# Patient Record
Sex: Male | Born: 1975
Health system: Southern US, Community
[De-identification: ages and names within clinical notes are randomized; demographics above are authoritative.]

## PROBLEM LIST (undated history)

## (undated) DIAGNOSIS — N200 Calculus of kidney: Secondary | ICD-10-CM

## (undated) DIAGNOSIS — E785 Hyperlipidemia, unspecified: Secondary | ICD-10-CM

## (undated) DIAGNOSIS — R519 Headache, unspecified: Secondary | ICD-10-CM

## (undated) DIAGNOSIS — K219 Gastro-esophageal reflux disease without esophagitis: Secondary | ICD-10-CM

## (undated) DIAGNOSIS — R51 Headache: Secondary | ICD-10-CM

## (undated) HISTORY — DX: Calculus of kidney: N20.0

## (undated) HISTORY — DX: Hyperlipidemia, unspecified: E78.5

## (undated) HISTORY — PX: OTHER SURGICAL HISTORY: SHX169

---

## 2015-06-23 ENCOUNTER — Ambulatory Visit (INDEPENDENT_AMBULATORY_CARE_PROVIDER_SITE_OTHER): Payer: BLUE CROSS/BLUE SHIELD | Admitting: Family Medicine

## 2015-06-23 ENCOUNTER — Encounter: Payer: Self-pay | Admitting: Family Medicine

## 2015-06-23 VITALS — BP 125/85 | HR 60 | Temp 98.1°F | Ht 68.5 in | Wt 146.5 lb

## 2015-06-23 DIAGNOSIS — R42 Dizziness and giddiness: Secondary | ICD-10-CM

## 2015-06-23 DIAGNOSIS — M542 Cervicalgia: Secondary | ICD-10-CM

## 2015-06-23 DIAGNOSIS — N200 Calculus of kidney: Secondary | ICD-10-CM

## 2015-06-23 LAB — CBC
HCT: 44.4 % (ref 38.5–50.0)
Hemoglobin: 14.9 g/dL (ref 13.2–17.1)
MCH: 30.7 pg (ref 27.0–33.0)
MCHC: 33.6 g/dL (ref 32.0–36.0)
MCV: 91.4 fL (ref 80.0–100.0)
MPV: 10.8 fL (ref 7.5–12.5)
PLATELETS: 204 10*3/uL (ref 140–400)
RBC: 4.86 MIL/uL (ref 4.20–5.80)
RDW: 13.2 % (ref 11.0–15.0)
WBC: 6.4 10*3/uL (ref 3.8–10.8)

## 2015-06-23 LAB — POCT URINALYSIS DIPSTICK
BILIRUBIN UA: NEGATIVE
GLUCOSE UA: NEGATIVE
KETONES UA: NEGATIVE
LEUKOCYTES UA: NEGATIVE
Nitrite, UA: NEGATIVE
PROTEIN UA: NEGATIVE
SPEC GRAV UA: 1.01
Urobilinogen, UA: 0.2
pH, UA: 6

## 2015-06-23 LAB — LIPID PANEL
CHOL/HDL RATIO: 3.8 ratio (ref <=5.0)
CHOLESTEROL: 234 mg/dL — AB (ref 125–200)
HDL: 62 mg/dL (ref >=40)
LDL Cholesterol: 159 mg/dL — ABNORMAL HIGH (ref <130)
TRIGLYCERIDES: 64 mg/dL (ref <150)
VLDL: 13 mg/dL (ref <30)

## 2015-06-23 LAB — HIV ANTIBODY (ROUTINE TESTING W REFLEX): HIV 1&2 Ab, 4th Generation: NONREACTIVE

## 2015-06-23 LAB — COMPLETE METABOLIC PANEL WITH GFR
ALK PHOS: 60 U/L (ref 40–115)
ALT: 15 U/L (ref 9–46)
AST: 20 U/L (ref 10–40)
Albumin: 4.3 g/dL (ref 3.6–5.1)
BILIRUBIN TOTAL: 0.8 mg/dL (ref 0.2–1.2)
BUN: 17 mg/dL (ref 7–25)
CALCIUM: 9 mg/dL (ref 8.6–10.3)
CO2: 27 mmol/L (ref 20–31)
Chloride: 101 mmol/L (ref 98–110)
Creat: 0.86 mg/dL (ref 0.60–1.35)
GFR, Est Non African American: >89 mL/min (ref >=60)
Glucose, Bld: 88 mg/dL (ref 65–99)
Potassium: 4.1 mmol/L (ref 3.5–5.3)
Sodium: 137 mmol/L (ref 135–146)
TOTAL PROTEIN: 7.9 g/dL (ref 6.1–8.1)

## 2015-06-23 LAB — POCT UA - MICROSCOPIC ONLY

## 2015-06-23 NOTE — Assessment & Plan Note (Signed)
Occasional lightheadedness at end of shift. Clinically suspect hypoglycemia. -patient to take a snack to work -check labs today

## 2015-06-23 NOTE — Assessment & Plan Note (Signed)
History of neck pain with occasional right hand numbness. Currently asymptomatic. -monitor clinically

## 2015-06-23 NOTE — Patient Instructions (Signed)
It was nice to meet you today.  Dr. Ree Kida will send you a letter with your results. My nurse will schedule the ultrasound for you.

## 2015-06-23 NOTE — Progress Notes (Signed)
   Subjective:    Patient ID: John Winters, male    DOB: June 12, 1975, 40 y.o.   MRN: [id number]  HPI 40 y/o [place] male presents for new patient visit. Vietnamese Scientist, research (physical sciences) Present.  Left kidney stone - diagnosed in [place] last year (by ultrasound), has rare pain in the left flank, no radiation to groin, no pain in urination, no blood in the urine  Neck pain - no current symptoms, ?pinched nerve on imaging from [place] (per patient record), some occasional right hand numbness (mostly at night, worse with certain sleep positions).   Lightheaded at end of shift (espcially after long shift), improved with food, does not eat during work.    Reviewed New Patient Health Record. Updated PMH/PSH/Meds/Allergies/Social history in EPIC.    Review of Systems  Constitutional: Negative for fever, chills and fatigue.  Respiratory: Negative for cough and shortness of breath.   Cardiovascular: Negative for chest pain and leg swelling.  Gastrointestinal: Negative for nausea, vomiting, abdominal pain and diarrhea.       Objective:   Physical Exam BP 125/85 mmHg  Pulse 60  Temp(Src) 98.1 F (36.7 C) (Oral)  Ht 5' 8.5" (1.74 m)  Wt 146 lb 8 oz (66.452 kg)  BMI 21.95 kg/m2  Gen: pleasant Asian male, NAD HEENT: normocephalic, PERRL, EOMI, no scleral icterus, MMM, uvula midline, no thyromegaly, no cervical adenopathy Cardiac: RRR, S1 and S2 present, no murmur Resp: CTAB, normal effort Abd: soft, no tenderness, normal bowel sounds Ext: no edema, 2+ radial and DP pulses, scar over right peck/nipple that is well healed Neuro: CN 2-12 intact, strength grossly 5/5 in all extremities, normal gait      Assessment & Plan:  Kidney stone on left side History of left kidney stone in [place]. Asymptomatic -check UA and renal US  Neck pain History of neck pain with occasional right hand numbness. Currently asymptomatic. -monitor clinically  Lightheadedness Occasional lightheadedness at  end of shift. Clinically suspect hypoglycemia. -patient to take a snack to work -check labs today

## 2015-06-23 NOTE — Assessment & Plan Note (Signed)
>>  ASSESSMENT AND PLAN FOR KIDNEY STONE ON LEFT SIDE WRITTEN ON 06/23/2015 10:25 AM BY Uvaldo Rising, MD  History of left kidney stone in [place]. Asymptomatic -check UA and renal US

## 2015-06-23 NOTE — Assessment & Plan Note (Signed)
History of left kidney stone in [place]. Asymptomatic -check UA and renal US

## 2015-06-27 ENCOUNTER — Encounter: Payer: Self-pay | Admitting: Family Medicine

## 2015-06-27 DIAGNOSIS — E785 Hyperlipidemia, unspecified: Secondary | ICD-10-CM | POA: Insufficient documentation

## 2015-06-29 ENCOUNTER — Ambulatory Visit (HOSPITAL_COMMUNITY)
Admission: RE | Admit: 2015-06-29 | Discharge: 2015-06-29 | Disposition: A | Discharge status: Home / Self Care | Payer: BLUE CROSS/BLUE SHIELD | Source: Ambulatory Visit | Attending: Family Medicine | Admitting: Family Medicine

## 2015-06-29 DIAGNOSIS — N200 Calculus of kidney: Secondary | ICD-10-CM | POA: Insufficient documentation

## 2015-06-30 ENCOUNTER — Telehealth: Payer: Self-pay | Admitting: Family Medicine

## 2015-06-30 DIAGNOSIS — N200 Calculus of kidney: Secondary | ICD-10-CM

## 2015-06-30 DIAGNOSIS — E785 Hyperlipidemia, unspecified: Secondary | ICD-10-CM

## 2015-06-30 NOTE — Assessment & Plan Note (Signed)
>>  ASSESSMENT AND PLAN FOR KIDNEY STONE ON LEFT SIDE WRITTEN ON 06/30/2015  1:57 PM BY Uvaldo Rising, MD  [place] 06/2015 - 18 mm shadowing focus left uretopelvic juncion

## 2015-06-30 NOTE — Assessment & Plan Note (Signed)
[place] 06/2015 - 18 mm shadowing focus left uretopelvic juncion

## 2015-06-30 NOTE — Telephone Encounter (Signed)
Called patient to discuss renal US results (used [employer]). No answer. Left message that I would call again to discuss results.

## 2015-07-05 ENCOUNTER — Encounter: Payer: Self-pay | Admitting: Family Medicine

## 2015-07-05 ENCOUNTER — Telehealth: Payer: Self-pay | Admitting: Family Medicine

## 2015-07-05 DIAGNOSIS — N200 Calculus of kidney: Secondary | ICD-10-CM

## 2015-07-05 NOTE — Assessment & Plan Note (Signed)
Placed referral to Urology.

## 2015-07-05 NOTE — Telephone Encounter (Signed)
Called patient and informed him of kidney stone in the left ureter. Will place referral to urology.

## 2015-07-05 NOTE — Telephone Encounter (Signed)
Attempted to call patient again about [place] results. No answer. Left message that I would send a letter with the results. Used [employer].

## 2015-07-05 NOTE — Assessment & Plan Note (Signed)
>>  ASSESSMENT AND PLAN FOR KIDNEY STONE ON LEFT SIDE WRITTEN ON 07/05/2015  3:59 PM BY Uvaldo Rising, MD  Placed referral to Urology.

## 2015-07-13 ENCOUNTER — Other Ambulatory Visit: Payer: Self-pay | Admitting: Urology

## 2015-07-13 DIAGNOSIS — N200 Calculus of kidney: Secondary | ICD-10-CM

## 2015-07-13 DIAGNOSIS — N1330 Unspecified hydronephrosis: Secondary | ICD-10-CM

## 2015-08-26 ENCOUNTER — Ambulatory Visit (HOSPITAL_COMMUNITY)
Admission: RE | Admit: 2015-08-26 | Discharge: 2015-08-26 | Disposition: A | Discharge status: Home / Self Care | Payer: BLUE CROSS/BLUE SHIELD | Source: Ambulatory Visit | Attending: Urology | Admitting: Urology

## 2015-08-26 DIAGNOSIS — N130 Hydronephrosis with ureteropelvic junction obstruction: Secondary | ICD-10-CM | POA: Diagnosis not present

## 2015-08-26 DIAGNOSIS — N1330 Unspecified hydronephrosis: Secondary | ICD-10-CM

## 2015-08-26 DIAGNOSIS — N200 Calculus of kidney: Secondary | ICD-10-CM

## 2015-08-26 DIAGNOSIS — R39198 Other difficulties with micturition: Secondary | ICD-10-CM | POA: Diagnosis not present

## 2015-08-26 MED ORDER — FUROSEMIDE 10 MG/ML IJ SOLN
INTRAMUSCULAR | Status: AC
  Filled 2015-08-26: qty 4

## 2015-08-26 MED ORDER — TECHNETIUM TC 99M MERTIATIDE
15.0000 | Freq: Once | INTRAVENOUS | Status: AC | PRN
  Administered 2015-08-26: 15 via INTRAVENOUS

## 2015-08-26 MED ORDER — FUROSEMIDE 10 MG/ML IJ SOLN
33.0000 mg | Freq: Once | INTRAMUSCULAR | Status: AC
Start: 2015-08-26 — End: 2015-08-26
  Administered 2015-08-26: 33 mg via INTRAVENOUS

## 2015-08-30 ENCOUNTER — Other Ambulatory Visit: Payer: Self-pay | Admitting: Urology

## 2015-09-08 NOTE — Patient Instructions (Signed)
John Winters  09/08/2015   Your procedure is scheduled on: 09/16/2015    Report to [facility] Main  Entrance take [place]  elevators to 3rd floor to  [place] at    0800 AM.  Call this number if you have problems the morning of surgery [phone]   Remember: ONLY 1 PERSON MAY GO WITH YOU TO SHORT STAY TO GET  READY MORNING OF [place].  Do not eat food or drink liquids :After Midnight.            CLEAR LIQUID DIET ON Thursday 09/15/2015.             Magnesium Citrate - DRINK  1 bottle by 12 NOON THE DAY BEFORE SURGERY       Take these medicines the morning of surgery with A SIP OF WATER: none                                 You may not have any metal on your body including hair pins and              piercings  Do not wear jewelry,  lotions, powders or perfumes, deodorant                         Men may shave face and neck.   Do not bring valuables to the hospital. [place].  Contacts, dentures or bridgework may not be worn into surgery.  Leave suitcase in the car. After surgery it may be brought to your room.      Special Instructions:               Please read over the following fact sheets you were given: _____________________________________________________________________                CLEAR LIQUID DIET   Foods Allowed                                                                     Foods Excluded  Coffee and tea, regular and decaf                             liquids that you cannot  Plain Jell-O in any flavor                                             see through such as: Fruit ices (not with fruit pulp)                                     milk, soups, orange juice  Iced Popsicles  All solid food Carbonated beverages, regular and diet                                    Cranberry, grape and apple juices Sports drinks like Gatorade Lightly  seasoned clear broth or consume(fat free) Sugar, honey syrup  Sample Menu Breakfast                                Lunch                                     Supper Cranberry juice                    Beef broth                            Chicken broth Jell-O                                     Grape juice                           Apple juice Coffee or tea                        Jell-O                                      Popsicle                                                Coffee or tea                        Coffee or tea  _____________________________________________________________________  [facility] - Preparing for Surgery Before surgery, you can play an important role.  Because skin is not sterile, your skin needs to be as free of germs as possible.  You can reduce the number of germs on your skin by washing with CHG (chlorahexidine gluconate) soap before surgery.  CHG is an antiseptic cleaner which kills germs and bonds with the skin to continue killing germs even after washing. Please DO NOT use if you have an allergy to CHG or antibacterial soaps.  If your skin becomes reddened/irritated stop using the CHG and inform your nurse when you arrive at Short Stay. Do not shave (including legs and underarms) for at least 48 hours prior to the first CHG shower.  You may shave your face/neck. Please follow these instructions carefully:  1.  Shower with CHG Soap the night before surgery and the  morning of Surgery.  2.  If you choose to wash your hair, wash your hair first as usual with your  normal  shampoo.  3.  After you shampoo, rinse your hair and body thoroughly to remove the  shampoo.  4.  Use CHG as you would any other liquid soap.  You can apply chg directly  to the skin and wash                       Gently with a scrungie or clean washcloth.  5.  Apply the CHG Soap to your body ONLY FROM THE NECK DOWN.   Do not use on face/ open                            Wound or open sores. Avoid contact with eyes, ears mouth and genitals (private parts).                       Wash face,  Genitals (private parts) with your normal soap.             6.  Wash thoroughly, paying special attention to the area where your surgery  will be performed.  7.  Thoroughly rinse your body with warm water from the neck down.  8.  DO NOT shower/wash with your normal soap after using and rinsing off  the CHG Soap.                9.  Pat yourself dry with a clean towel.            10.  Wear clean pajamas.            11.  Place clean sheets on your bed the night of your first shower and do not  sleep with pets. Day of Surgery : Do not apply any lotions/deodorants the morning of surgery.  Please wear clean clothes to the hospital/surgery center.  FAILURE TO FOLLOW THESE INSTRUCTIONS MAY RESULT IN THE CANCELLATION OF YOUR SURGERY PATIENT SIGNATURE_________________________________  NURSE SIGNATURE__________________________________  ________________________________________________________________________

## 2015-09-12 ENCOUNTER — Encounter (HOSPITAL_COMMUNITY)
Admission: RE | Admit: 2015-09-12 | Discharge: 2015-09-12 | Disposition: A | Discharge status: Home / Self Care | Payer: BLUE CROSS/BLUE SHIELD | Source: Ambulatory Visit | Attending: Urology | Admitting: Urology

## 2015-09-12 ENCOUNTER — Encounter (HOSPITAL_COMMUNITY): Payer: Self-pay

## 2015-09-12 DIAGNOSIS — N200 Calculus of kidney: Secondary | ICD-10-CM | POA: Diagnosis not present

## 2015-09-12 DIAGNOSIS — Z01812 Encounter for preprocedural laboratory examination: Secondary | ICD-10-CM | POA: Diagnosis present

## 2015-09-12 DIAGNOSIS — N201 Calculus of ureter: Secondary | ICD-10-CM | POA: Insufficient documentation

## 2015-09-12 HISTORY — DX: Headache: R51

## 2015-09-12 HISTORY — DX: Gastro-esophageal reflux disease without esophagitis: K21.9

## 2015-09-12 HISTORY — DX: Headache, unspecified: R51.9

## 2015-09-12 LAB — CBC
HCT: 44.3 % (ref 39.0–52.0)
Hemoglobin: 15.7 g/dL (ref 13.0–17.0)
MCH: 31.6 pg (ref 26.0–34.0)
MCHC: 35.4 g/dL (ref 30.0–36.0)
MCV: 89.1 fL (ref 78.0–100.0)
PLATELETS: 210 10*3/uL (ref 150–400)
RBC: 4.97 MIL/uL (ref 4.22–5.81)
RDW: 12.4 % (ref 11.5–15.5)
WBC: 5.8 10*3/uL (ref 4.0–10.5)

## 2015-09-12 LAB — BASIC METABOLIC PANEL
Anion gap: 5 (ref 5–15)
BUN: 20 mg/dL (ref 6–20)
CALCIUM: 9.1 mg/dL (ref 8.9–10.3)
CO2: 28 mmol/L (ref 22–32)
CREATININE: 0.78 mg/dL (ref 0.61–1.24)
Chloride: 105 mmol/L (ref 101–111)
GFR calc non Af Amer: >60 mL/min (ref >60)
Glucose, Bld: 103 mg/dL — ABNORMAL HIGH (ref 65–99)
Potassium: 4.6 mmol/L (ref 3.5–5.1)
SODIUM: 138 mmol/L (ref 135–145)

## 2015-09-15 MED ORDER — GENTAMICIN SULFATE 40 MG/ML IJ SOLN
5.0000 mg/kg | INTRAVENOUS | Status: AC
  Administered 2015-09-16: 340 mg via INTRAVENOUS
  Filled 2015-09-15: qty 8.5

## 2015-09-16 ENCOUNTER — Inpatient Hospital Stay (HOSPITAL_COMMUNITY): Payer: BLUE CROSS/BLUE SHIELD

## 2015-09-16 ENCOUNTER — Encounter (HOSPITAL_COMMUNITY): Payer: Self-pay

## 2015-09-16 ENCOUNTER — Observation Stay (HOSPITAL_COMMUNITY)
Admission: RE | Admit: 2015-09-16 | Discharge: 2015-09-17 | Disposition: A | Discharge status: Home / Self Care | Payer: BLUE CROSS/BLUE SHIELD | Source: Ambulatory Visit | Attending: Urology | Admitting: Urology

## 2015-09-16 ENCOUNTER — Inpatient Hospital Stay (HOSPITAL_COMMUNITY): Payer: BLUE CROSS/BLUE SHIELD | Admitting: Certified Registered Nurse Anesthetist

## 2015-09-16 ENCOUNTER — Encounter (HOSPITAL_COMMUNITY)
Admission: RE | Disposition: A | Discharge status: Home / Self Care | Payer: Self-pay | Source: Ambulatory Visit | Attending: Urology

## 2015-09-16 DIAGNOSIS — Z8249 Family history of ischemic heart disease and other diseases of the circulatory system: Secondary | ICD-10-CM | POA: Insufficient documentation

## 2015-09-16 DIAGNOSIS — N132 Hydronephrosis with renal and ureteral calculous obstruction: Principal | ICD-10-CM | POA: Insufficient documentation

## 2015-09-16 DIAGNOSIS — Z419 Encounter for procedure for purposes other than remedying health state, unspecified: Secondary | ICD-10-CM

## 2015-09-16 DIAGNOSIS — N200 Calculus of kidney: Secondary | ICD-10-CM | POA: Diagnosis present

## 2015-09-16 DIAGNOSIS — K219 Gastro-esophageal reflux disease without esophagitis: Secondary | ICD-10-CM | POA: Insufficient documentation

## 2015-09-16 HISTORY — PX: CYSTOSCOPY W/ URETERAL STENT PLACEMENT: SHX1429

## 2015-09-16 HISTORY — PX: NEPHROLITHOTOMY: SHX5134

## 2015-09-16 LAB — ABO/RH: ABO/RH(D): A POS

## 2015-09-16 LAB — HEMOGLOBIN AND HEMATOCRIT, BLOOD
HCT: 45.1 % (ref 39.0–52.0)
Hemoglobin: 15.2 g/dL (ref 13.0–17.0)

## 2015-09-16 LAB — TYPE AND SCREEN
ABO/RH(D): A POS
Antibody Screen: NEGATIVE

## 2015-09-16 SURGERY — NEPHROLITHOTOMY PERCUTANEOUS
Anesthesia: General | Laterality: Left

## 2015-09-16 MED ORDER — KCL IN DEXTROSE-NACL 20-5-0.45 MEQ/L-%-% IV SOLN
INTRAVENOUS | Status: DC
  Administered 2015-09-16 – 2015-09-17 (×2): via INTRAVENOUS
  Filled 2015-09-16 (×2): qty 1000

## 2015-09-16 MED ORDER — OXYCODONE HCL 5 MG/5ML PO SOLN: 5.0000 mg | Freq: Once | ORAL | Status: DC | PRN

## 2015-09-16 MED ORDER — SENNOSIDES-DOCUSATE SODIUM 8.6-50 MG PO TABS: 1.0000 | ORAL_TABLET | Freq: Two times a day (BID) | ORAL | Status: DC

## 2015-09-16 MED ORDER — IOHEXOL 300 MG/ML  SOLN
INTRAMUSCULAR | Status: DC | PRN
  Administered 2015-09-16: 45 mL via URETHRAL

## 2015-09-16 MED ORDER — PROPOFOL 10 MG/ML IV BOLUS
INTRAVENOUS | Status: AC
  Filled 2015-09-16: qty 20

## 2015-09-16 MED ORDER — ROCURONIUM BROMIDE 100 MG/10ML IV SOLN
INTRAVENOUS | Status: DC | PRN
  Administered 2015-09-16: 20 mg via INTRAVENOUS
  Administered 2015-09-16 (×3): 10 mg via INTRAVENOUS
  Administered 2015-09-16: 50 mg via INTRAVENOUS

## 2015-09-16 MED ORDER — MIDAZOLAM HCL 2 MG/2ML IJ SOLN
INTRAMUSCULAR | Status: AC
  Filled 2015-09-16: qty 2

## 2015-09-16 MED ORDER — LACTATED RINGERS IV SOLN
INTRAVENOUS | Status: DC
  Administered 2015-09-16: 1000 mL via INTRAVENOUS
  Administered 2015-09-16: 13:00:00 via INTRAVENOUS

## 2015-09-16 MED ORDER — EPHEDRINE SULFATE 50 MG/ML IJ SOLN
INTRAMUSCULAR | Status: DC | PRN
  Administered 2015-09-16: 5 mg via INTRAVENOUS

## 2015-09-16 MED ORDER — ONDANSETRON HCL 4 MG/2ML IJ SOLN
INTRAMUSCULAR | Status: AC
  Filled 2015-09-16: qty 2

## 2015-09-16 MED ORDER — LIDOCAINE HCL (CARDIAC) 20 MG/ML IV SOLN
INTRAVENOUS | Status: AC
  Filled 2015-09-16: qty 5

## 2015-09-16 MED ORDER — LIDOCAINE HCL (CARDIAC) 20 MG/ML IV SOLN
INTRAVENOUS | Status: DC | PRN
  Administered 2015-09-16: 50 mg via INTRAVENOUS

## 2015-09-16 MED ORDER — CEPHALEXIN 500 MG PO CAPS: 500.0000 mg | ORAL_CAPSULE | Freq: Two times a day (BID) | ORAL | Status: DC

## 2015-09-16 MED ORDER — METOCLOPRAMIDE HCL 5 MG/ML IJ SOLN
INTRAMUSCULAR | Status: AC
  Filled 2015-09-16: qty 2

## 2015-09-16 MED ORDER — METOCLOPRAMIDE HCL 5 MG/ML IJ SOLN
INTRAMUSCULAR | Status: DC | PRN
  Administered 2015-09-16: 10 mg via INTRAVENOUS

## 2015-09-16 MED ORDER — FENTANYL CITRATE (PF) 250 MCG/5ML IJ SOLN
INTRAMUSCULAR | Status: AC
  Filled 2015-09-16: qty 5

## 2015-09-16 MED ORDER — ONDANSETRON HCL 4 MG/2ML IJ SOLN
INTRAMUSCULAR | Status: DC | PRN
  Administered 2015-09-16: 4 mg via INTRAVENOUS

## 2015-09-16 MED ORDER — HYDROMORPHONE HCL 1 MG/ML IJ SOLN: 0.5000 mg | INTRAMUSCULAR | Status: DC | PRN

## 2015-09-16 MED ORDER — FENTANYL CITRATE (PF) 100 MCG/2ML IJ SOLN
INTRAMUSCULAR | Status: DC | PRN
  Administered 2015-09-16 (×5): 50 ug via INTRAVENOUS

## 2015-09-16 MED ORDER — OXYCODONE-ACETAMINOPHEN 5-325 MG PO TABS: 1.0000 | ORAL_TABLET | ORAL | Status: DC | PRN

## 2015-09-16 MED ORDER — OXYCODONE HCL 5 MG PO TABS: 5.0000 mg | ORAL_TABLET | Freq: Once | ORAL | Status: DC | PRN

## 2015-09-16 MED ORDER — DEXAMETHASONE SODIUM PHOSPHATE 4 MG/ML IJ SOLN
INTRAMUSCULAR | Status: DC | PRN
  Administered 2015-09-16: 10 mg via INTRAVENOUS

## 2015-09-16 MED ORDER — MAGNESIUM CITRATE PO SOLN: 1.0000 | Freq: Once | ORAL | Status: DC

## 2015-09-16 MED ORDER — SENNOSIDES-DOCUSATE SODIUM 8.6-50 MG PO TABS
1.0000 | ORAL_TABLET | Freq: Two times a day (BID) | ORAL | Status: DC
  Administered 2015-09-16 (×2): 1 via ORAL
  Filled 2015-09-16 (×3): qty 1

## 2015-09-16 MED ORDER — HYDROMORPHONE HCL 1 MG/ML IJ SOLN: 0.2500 mg | INTRAMUSCULAR | Status: DC | PRN

## 2015-09-16 MED ORDER — ACETAMINOPHEN 500 MG PO TABS
1000.0000 mg | ORAL_TABLET | Freq: Three times a day (TID) | ORAL | Status: AC
  Administered 2015-09-16 – 2015-09-17 (×3): 1000 mg via ORAL
  Filled 2015-09-16 (×3): qty 2

## 2015-09-16 MED ORDER — SUGAMMADEX SODIUM 200 MG/2ML IV SOLN
INTRAVENOUS | Status: DC | PRN
  Administered 2015-09-16: 200 mg via INTRAVENOUS

## 2015-09-16 MED ORDER — SODIUM CHLORIDE 0.9 % IR SOLN
Status: DC | PRN
  Administered 2015-09-16: 15000 mL

## 2015-09-16 MED ORDER — ROCURONIUM BROMIDE 100 MG/10ML IV SOLN
INTRAVENOUS | Status: AC
  Filled 2015-09-16: qty 1

## 2015-09-16 MED ORDER — SUGAMMADEX SODIUM 200 MG/2ML IV SOLN
INTRAVENOUS | Status: AC
  Filled 2015-09-16: qty 2

## 2015-09-16 MED ORDER — MEPERIDINE HCL 50 MG/ML IJ SOLN: 6.2500 mg | INTRAMUSCULAR | Status: DC | PRN

## 2015-09-16 MED ORDER — PROPOFOL 10 MG/ML IV BOLUS
INTRAVENOUS | Status: DC | PRN
  Administered 2015-09-16: 200 mg via INTRAVENOUS

## 2015-09-16 MED ORDER — MIDAZOLAM HCL 5 MG/5ML IJ SOLN
INTRAMUSCULAR | Status: DC | PRN
  Administered 2015-09-16 (×2): 1 mg via INTRAVENOUS

## 2015-09-16 MED ORDER — DEXAMETHASONE SODIUM PHOSPHATE 10 MG/ML IJ SOLN
INTRAMUSCULAR | Status: AC
  Filled 2015-09-16: qty 1

## 2015-09-16 SURGICAL SUPPLY — 63 items
BAG URINE DRAINAGE (UROLOGICAL SUPPLIES) ×4 IMPLANT
BAG URO CATCHER STRL LF (MISCELLANEOUS) ×2 IMPLANT
BASKET LASER NITINOL 1.9FR (BASKET) ×4 IMPLANT
BASKET ZERO TIP NITINOL 2.4FR (BASKET) ×2 IMPLANT
BENZOIN TINCTURE PRP APPL 2/3 (GAUZE/BANDAGES/DRESSINGS) ×4 IMPLANT
BLADE SURG 15 STRL LF DISP TIS (BLADE) ×1 IMPLANT
BLADE SURG 15 STRL SS (BLADE) ×1
CATH FOLEY 2W COUNCIL 20FR 5CC (CATHETERS) IMPLANT
CATH FOLEY 2WAY SLVR  5CC 16FR (CATHETERS) ×1
CATH FOLEY 2WAY SLVR 5CC 16FR (CATHETERS) ×1 IMPLANT
CATH IMAGER II 65CM (CATHETERS) ×4 IMPLANT
CATH INTERMIT  6FR 70CM (CATHETERS) ×2 IMPLANT
CATH ROBINSON RED A/P 20FR (CATHETERS) IMPLANT
CATH X-FORCE N30 NEPHROSTOMY (TUBING) ×2 IMPLANT
CHLORAPREP W/TINT 26ML (MISCELLANEOUS) ×4 IMPLANT
CLOTH BEACON ORANGE TIMEOUT ST (SAFETY) ×2 IMPLANT
COVER SURGICAL LIGHT HANDLE (MISCELLANEOUS) ×2 IMPLANT
DRAPE C-ARM 42X120 X-RAY (DRAPES) ×2 IMPLANT
DRAPE LINGEMAN PERC (DRAPES) ×2 IMPLANT
DRAPE SHEET LG 3/4 BI-LAMINATE (DRAPES) ×2 IMPLANT
DRAPE SURG IRRIG POUCH 19X23 (DRAPES) ×2 IMPLANT
DRSG PAD ABDOMINAL 8X10 ST (GAUZE/BANDAGES/DRESSINGS) ×4 IMPLANT
DRSG TEGADERM 4X4.75 (GAUZE/BANDAGES/DRESSINGS) ×4 IMPLANT
DRSG TEGADERM 8X12 (GAUZE/BANDAGES/DRESSINGS) ×2 IMPLANT
FIBER LASER FLEXIVA 1000 (UROLOGICAL SUPPLIES) ×2 IMPLANT
FIBER LASER FLEXIVA 365 (UROLOGICAL SUPPLIES) IMPLANT
FIBER LASER FLEXIVA 550 (UROLOGICAL SUPPLIES) IMPLANT
FIBER LASER TRAC TIP (UROLOGICAL SUPPLIES) IMPLANT
GAUZE SPONGE 4X4 12PLY STRL (GAUZE/BANDAGES/DRESSINGS) ×2 IMPLANT
GLOVE BIOGEL M STRL SZ7.5 (GLOVE) ×6 IMPLANT
GOWN STRL REUS W/TWL LRG LVL3 (GOWN DISPOSABLE) ×4 IMPLANT
GUIDEWIRE AMPLAZ .035X145 (WIRE) ×4 IMPLANT
GUIDEWIRE ANG ZIPWIRE 038X150 (WIRE) ×4 IMPLANT
GUIDEWIRE STR DUAL SENSOR (WIRE) ×2 IMPLANT
IV SET EXTENSION CATH 6 NF (IV SETS) ×2 IMPLANT
KIT BASIN OR (CUSTOM PROCEDURE TRAY) ×2 IMPLANT
MANIFOLD NEPTUNE II (INSTRUMENTS) ×2 IMPLANT
NEEDLE TROCAR 18X15 ECHO (NEEDLE) IMPLANT
NEEDLE TROCAR 18X20 (NEEDLE) ×2 IMPLANT
NS IRRIG 1000ML POUR BTL (IV SOLUTION) IMPLANT
PACK CYSTO (CUSTOM PROCEDURE TRAY) ×2 IMPLANT
PROBE LITHOCLAST ULTRA 3.8X403 (UROLOGICAL SUPPLIES) ×2 IMPLANT
PROBE PNEUMATIC 1.0MMX570MM (UROLOGICAL SUPPLIES) ×2 IMPLANT
SET IRRIG Y TYPE TUR BLADDER L (SET/KITS/TRAYS/PACK) ×2 IMPLANT
SHEATH PEELAWAY SET 9 (SHEATH) ×4 IMPLANT
SPOGE SURGIFLO 8M (HEMOSTASIS) ×1
SPONGE LAP 4X18 X RAY DECT (DISPOSABLE) ×2 IMPLANT
SPONGE SURGIFLO 8M (HEMOSTASIS) ×1 IMPLANT
STENT URET 6FRX26 CONTOUR (STENTS) ×2 IMPLANT
STONE CATCHER W/TUBE ADAPTER (UROLOGICAL SUPPLIES) ×2 IMPLANT
SUT SILK 2 0 30  PSL (SUTURE) ×2
SUT SILK 2 0 30 PSL (SUTURE) ×2 IMPLANT
SUT VIC AB 2-0 SH 27 (SUTURE) ×1
SUT VIC AB 2-0 SH 27X BRD (SUTURE) ×1 IMPLANT
SYR 20CC LL (SYRINGE) ×4 IMPLANT
SYR 50ML LL SCALE MARK (SYRINGE) ×2 IMPLANT
SYRINGE 10CC LL (SYRINGE) ×2 IMPLANT
TOWEL OR 17X26 10 PK STRL BLUE (TOWEL DISPOSABLE) ×2 IMPLANT
TRAY FOLEY CATH 16FR SILVER (SET/KITS/TRAYS/PACK) ×2 IMPLANT
TUBE FEEDING 8FR 16IN STR KANG (MISCELLANEOUS) ×2 IMPLANT
TUBING CONNECTING 10 (TUBING) ×6 IMPLANT
WATER STERILE IRR 1500ML POUR (IV SOLUTION) ×2 IMPLANT
WATER STERILE IRR 3000ML UROMA (IV SOLUTION) IMPLANT

## 2015-09-16 NOTE — Anesthesia Preprocedure Evaluation (Addendum)
Anesthesia Evaluation  Patient identified by MRN, date of birth, ID band Patient awake    Reviewed: Allergy & Precautions, NPO status , Patient's Chart, lab work & pertinent test results  Airway Mallampati: I  TM Distance: >3 FB Neck ROM: Full    Dental  (+) Teeth Intact, Dental Advisory Given   Pulmonary    breath sounds clear to auscultation       Cardiovascular  Rhythm:Regular Rate:Normal     Neuro/Psych    GI/Hepatic GERD  Medicated and Controlled,  Endo/Other    Renal/GU      Musculoskeletal   Abdominal   Peds  Hematology   Anesthesia Other Findings Hx confirmed with interpreter.  Questions answered.  Reproductive/Obstetrics                            Anesthesia Physical Anesthesia Plan  ASA: II  Anesthesia Plan: General   Post-op Pain Management:    Induction: Intravenous  Airway Management Planned: Oral ETT  Additional Equipment:   Intra-op Plan:   Post-operative Plan: Extubation in OR  Informed Consent: I have reviewed the patients History and Physical, chart, labs and discussed the procedure including the risks, benefits and alternatives for the proposed anesthesia with the patient or authorized representative who has indicated his/her understanding and acceptance.   Dental advisory given  Plan Discussed with: CRNA, Anesthesiologist and Surgeon  Anesthesia Plan Comments:         Anesthesia Quick Evaluation

## 2015-09-16 NOTE — Anesthesia Postprocedure Evaluation (Signed)
Anesthesia Post Note  Patient: John Winters  Procedure(s) Performed: Procedure(s) (LRB): LEFT PERCUTANEOUS NEPHROLITHOTOMY WITH SURGEON ACCESS  (Left) CYSTOSCOPY WITH LEFT RETROGRADE PYELOGRAM LEFT URETERAL STENT PLACEMENT (Left)  Patient location during evaluation: PACU Anesthesia Type: General Level of consciousness: awake and alert Pain management: pain level controlled Vital Signs Assessment: post-procedure vital signs reviewed and stable Respiratory status: spontaneous breathing, nonlabored ventilation and respiratory function stable Cardiovascular status: blood pressure returned to baseline and stable Postop Assessment: no signs of nausea or vomiting Anesthetic complications: no    Last Vitals:  Filed Vitals:   09/16/15 1330 09/16/15 1345  BP: 122/84 123/84  Pulse: 65 69  Temp:  36.7 C  Resp: 18 17    Last Pain:  Filed Vitals:   09/16/15 1349  PainSc: Asleep                 Kai Calico A

## 2015-09-16 NOTE — Transfer of Care (Signed)
Immediate Anesthesia Transfer of Care Note  Patient: John Winters  Procedure(s) Performed: Procedure(s): LEFT PERCUTANEOUS NEPHROLITHOTOMY WITH SURGEON ACCESS  (Left) CYSTOSCOPY WITH LEFT RETROGRADE PYELOGRAM LEFT URETERAL STENT PLACEMENT (Left)  Patient Location: PACU  Anesthesia Type:General  Level of Consciousness: Patient easily awoken, sedated, comfortable, cooperative, following commands, responds to stimulation.   Airway & Oxygen Therapy: Patient spontaneously breathing, ventilating well, oxygen via simple oxygen mask.  Post-op Assessment: Report given to PACU RN, vital signs reviewed and stable, moving all extremities.   Post vital signs: Reviewed and stable.  Complications: No apparent anesthesia complications

## 2015-09-16 NOTE — Brief Op Note (Signed)
09/16/2015  12:34 PM  PATIENT:  Waunita Schooner  40 y.o. male  PRE-OPERATIVE DIAGNOSIS:  LARGE LEFT RENAL / URETERAL STONE  POST-OPERATIVE DIAGNOSIS:  LARGE LEFT RENAL / URETERAL STONE  PROCEDURE:  Procedure(s): LEFT PERCUTANEOUS NEPHROLITHOTOMY WITH SURGEON ACCESS  (Left) CYSTOSCOPY WITH LEFT RETROGRADE PYELOGRAM LEFT URETERAL STENT PLACEMENT (Left)  SURGEON:  Surgeon(s) and Role:    * Alexis Frock, MD - Primary  PHYSICIAN ASSISTANT:   ASSISTANTS: none   ANESTHESIA:   general  EBL:     BLOOD ADMINISTERED:none  DRAINS: foley to gravity   LOCAL MEDICATIONS USED:  NONE  SPECIMEN:  Source of Specimen:  Left real stone fragmets   DISPOSITION OF SPECIMEN:  [facility] for compositional analysis  COUNTS:  YES  TOURNIQUET:  * No tourniquets in log *  DICTATION: .Other Dictation: Dictation Number [id number]  PLAN OF CARE: Admit for overnight observation  PATIENT DISPOSITION:  PACU - hemodynamically stable.   Delay start of Pharmacological VTE agent (>24hrs) due to surgical blood loss or risk of bleeding: yes

## 2015-09-16 NOTE — H&P (Signed)
John Winters is an 40 y.o. male.    Chief Complaint: Pre-op LEFT percutaneous nephrostolithotomy with access  HPI:  1 - Severe left hydronephrosis / nephrolithiasis - severe left hydro to likely large proximal ureteral / UPJ stone by [place] 06/2015. Contralateral kidney normal. Cr 0.89 / lyts normal 06/2015. This has been present since about 2010 per report.   Contrast CT and renogram 08/2015 confirm preserved left renal funtion (52% v. Rt 48%), large left hydro to 108mm UPJ stone, 56mm renal stone. Stone partially obstructing.   Today "John Winters" is seen to proceed with left percutaneous neprhostolithotomy for his multifocal left renal stones.    Past Medical History  Diagnosis Date  . GERD (gastroesophageal reflux disease)   . Headache     Past Surgical History  Procedure Laterality Date  . Right breast sugery Right     gynecomastia  . Removed tumor from skin       Family History  Problem Relation Age of Onset  . Hyperlipidemia Father    Social History:  reports that he has never smoked. He does not have any smokeless tobacco history on file. He reports that he drinks about 1.2 oz of alcohol per week. He reports that he does not use illicit drugs.  Allergies: No Known Allergies  No prescriptions prior to admission    No results found for this or any previous visit (from the past 48 hour(s)). No results found.  Review of Systems  Constitutional: Negative.  Negative for fever, chills and malaise/fatigue.  HENT: Negative.   Eyes: Negative.   Respiratory: Negative.   Cardiovascular: Negative.   Gastrointestinal: Negative.   Genitourinary: Negative.   Musculoskeletal: Negative.   Skin: Negative.   Neurological: Negative.   Endo/Heme/Allergies: Negative.  Negative for polydipsia.  Psychiatric/Behavioral: Negative.     There were no vitals taken for this visit. Physical Exam  Constitutional: He appears well-developed.  HENT:  Head: Normocephalic.  Eyes: Pupils are equal,  round, and reactive to light.  Neck: Normal range of motion.  Cardiovascular: Normal rate.   Respiratory: Effort normal.  GI: Soft.  Genitourinary:  Minimal left CVAT  Musculoskeletal: Normal range of motion.  Neurological: He is alert.  Skin: Skin is warm.  Psychiatric: He has a normal mood and affect. His behavior is normal. Judgment and thought content normal.     Assessment/Plan   1 - Severe left hydronephrosis / nephrolithiasis - proceed as planned with LEFT percutaneous nephrostolithotomy with surgeon access today. Risks, benefits, alternatives, and expected peri-op course discussed in detail previously and reiterated again today.    Alexis Frock, MD 09/16/2015, 5:48 AM

## 2015-09-16 NOTE — Progress Notes (Signed)
Interpretor services used with patient to ask about pain, admission, explain plan of care and answer any questions.  Interpretor uses: [place] :[id number].

## 2015-09-16 NOTE — Discharge Instructions (Signed)
1 - You may have urinary urgency (bladder spasms) and bloody urine on / off with stent in place. This is normal. ° °2 - Call MD or go to ER for fever >102, severe pain / nausea / vomiting not relieved by medications, or acute change in medical status ° °

## 2015-09-16 NOTE — Anesthesia Procedure Notes (Signed)
Procedure Name: Intubation Date/Time: 09/16/2015 10:28 AM Performed by: Deliah Boston Pre-anesthesia Checklist: Patient identified, Emergency Drugs available, Suction available and Patient being monitored Patient Re-evaluated:Patient Re-evaluated prior to inductionOxygen Delivery Method: Circle system utilized Preoxygenation: Pre-oxygenation with 100% oxygen Intubation Type: IV induction Ventilation: Mask ventilation without difficulty Laryngoscope Size: Mac and 4 Grade View: Grade I Tube type: Oral Tube size: 7.5 mm Number of attempts: 1 Airway Equipment and Method: Bite block (soft bite block placed) Placement Confirmation: ETT inserted through vocal cords under direct vision,  positive ETCO2 and breath sounds checked- equal and bilateral Secured at: 22 cm Tube secured with: Tape Dental Injury: Teeth and Oropharynx as per pre-operative assessment

## 2015-09-17 DIAGNOSIS — N200 Calculus of kidney: Secondary | ICD-10-CM

## 2015-09-17 DIAGNOSIS — N132 Hydronephrosis with renal and ureteral calculous obstruction: Secondary | ICD-10-CM | POA: Diagnosis not present

## 2015-09-17 LAB — BASIC METABOLIC PANEL
ANION GAP: 5 (ref 5–15)
BUN: 16 mg/dL (ref 6–20)
CHLORIDE: 104 mmol/L (ref 101–111)
CO2: 26 mmol/L (ref 22–32)
Calcium: 8.7 mg/dL — ABNORMAL LOW (ref 8.9–10.3)
Creatinine, Ser: 1.12 mg/dL (ref 0.61–1.24)
Glucose, Bld: 129 mg/dL — ABNORMAL HIGH (ref 65–99)
POTASSIUM: 4.2 mmol/L (ref 3.5–5.1)
SODIUM: 135 mmol/L (ref 135–145)

## 2015-09-17 LAB — HEMOGLOBIN AND HEMATOCRIT, BLOOD
HCT: 43.3 % (ref 39.0–52.0)
Hemoglobin: 14.5 g/dL (ref 13.0–17.0)

## 2015-09-17 NOTE — Discharge Summary (Signed)
Physician Discharge Summary  Patient ID: John Winters MRN: [id number] DOB/AGE: May 14, 1975 40 y.o.  Admit date: 09/16/2015 Discharge date: 09/17/2015  Admission Diagnoses:Left renal calculus  Discharge Diagnoses:  Active Problems:   Left nephrolithiasis   Renal calculus   Discharged Condition: good  Hospital Course: Mr. Shelly was admitted for an elective left percutaneous nephrolithotomy. He underwent this procedure without complication was observed overnight. He has done well. He denies any pain. He is tolerating regular diet. He is not having any significant hematuria and his hemoglobin and hematocrit remained stable with normal electrolytes. He is felt ready for discharge.    Discharge Exam: Blood pressure 116/83, pulse 62, temperature 98.1 F (36.7 C), temperature source Oral, resp. rate 18, height 5\' 8"  (1.727 m), weight 67.586 kg (149 lb), SpO2 100 %. General appearance: alert, cooperative and no distress  Abdomen is soft.   Left flank is without ecchymoses  Disposition: Final discharge disposition not confirmed  Discharge Instructions    Discharge patient    Complete by:  As directed             Medication List    TAKE these medications        cephALEXin 500 MG capsule  Commonly known as:  KEFLEX  Take 1 capsule (500 mg total) by mouth 2 (two) times daily. X 3 days. Begin day before next Urology appointment.     oxyCODONE-acetaminophen 5-325 MG tablet  Commonly known as:  PERCOCET/ROXICET  Take 1-2 tablets by mouth every 4 (four) hours as needed for moderate pain or severe pain. Post-operatively     senna-docusate 8.6-50 MG tablet  Commonly known as:  Senokot-S  Take 1 tablet by mouth 2 (two) times daily. While taking pain meds to prevent constipation           Follow-up Information    Follow up with Alexis Frock, MD On 10/03/2015.   Specialty:  Urology   Why:  at 12:45 for MD visit and office stent removal   Contact information:   [place] [address] [phone]       Signed: Claybon Jabs 09/17/2015, 9:00 AM

## 2015-09-17 NOTE — Op Note (Signed)
NAMELONIE, FOLAND NO.:  [id number]  MEDICAL RECORD NO.:  [id number]  LOCATION:  [id number]                         FACILITY:  [facility]  PHYSICIAN:  Alexis Frock, MD     DATE OF BIRTH:  16-Oct-1975  DATE OF PROCEDURE: 09/16/2015                               OPERATIVE REPORT   DIAGNOSIS:  Very large left renal stone.  PROCEDURE: 1. Cystoscopy with left retrograde pyelogram and interpretation. 2. Left percutaneous nephrostolithotomy, stone greater than 2 cm. 3. Needle access left kidney. 4. Dilation of left percutaneous tract. 5. Left antegrade diagnostic ureteroscopy. 6. Placement of left ureteral stent, 6 x 26, contour, no tether.  ESTIMATED BLOOD LOSS:  100 mL.  COMPLICATIONS:  None.  SPECIMEN:  Left renal stone fragments for compositional analysis.  FINDINGS: 1. Severe left hydronephrosis with filled defect at the UPJ, consistent with a     very large known stone. 2. Dominant left UPJ, approximately 2.2 cm calculus, partially     obstructing with several small intrarenal stones. 3. Complete resolution of all accessible stone fragments larger than     1/3rd mm following percutaneous stone extraction in antegrade     ureteroscopy. 4. Successful placement of antegrade ureteral stent, 6 x 26 contour     proximal renal pelvis, distal in urinary bladder.  INDICATION:  [name] is a very pleasant, 40 year old [place] gentleman with a known longstanding history of partial obstructing left UPJ stone that has become increasing symptomatic.  Recent axial imaging revealed stone had progressed to size approximately 2.2 cm with several satellite stones as well.  He had relatively preserved parenchyma and nuclear medicine renogram corroborated greater than 30% relative left renal function.  It is felt that definitive stone management with goal of saving his left kidney would be warranted, and he wished to proceed with percutaneous approach.  Informed consent was  obtained and placed in medical record.  PROCEDURE IN DETAIL:  The patient being John Winters was verified. Procedure being left percutaneous nephrostolithotomy was confirmed. Procedure was carried out.  Intravenous antibiotics were administered. General endotracheal anesthesia was induced.  Patient was initially placed into a low lithotomy position.  A sterile field was created by prepping the patient's penis, perineum, proximal thighs using iodine x3. Next, cystourethroscopy was performed using a 23-French rigid cystoscope with offset lens.  Inspection of anterior and posterior urethra was unremarkable.  Inspection of bladder revealed no diverticula, calcifications, or papular lesions.  The left ureteral orifice was cannulated with 6-French end-hole catheter and left retrograde pyelogram was obtained.  Left retrograde pyelogram demonstrated a single left ureter with single system left kidney.  There was a very large filling defect at the area of the UPJ consistent with known stone.  Contrast was able to traverse proximal to this, which revealed marked left hydronephrosis.  A 0.038 zip wire was navigated at the level of the upper pole.  An open-ended catheter placed into the mid pole of the left kidney.  Foley catheter was placed per urethra to straight drain.  The open-ended catheter was fashioned to this with addition of IV extension tubing.  The patient was carefully repositioned into a full prone  position applying 10 degrees of table flexion to maximize the space between his 12th rib and iliac crest.  His entire left flank was prepped and draped using chlorhexidine gluconate as well as the in situ externalized left ureteral stent. Using simultaneous retrograde filling and a spot fluoroscopy at the 15 degrees off center, a suitable lower mid calyx was found for percutaneous access using bull's eye technique.  An 18-gauge Chiba needle was used to cannulate the kidney.  This was  verified by the efflux of copious clear urine.  A 0.038 zip wire was then advanced, coiled in the renal pelvis and navigated down the ureter via the guide of a KMP type catheter.  This was exchanged for a Superstiff wire.  The coaxial introducer was then used, carefully placed at the level of proximal ureter, and the zip wire was once again advanced via the urinary bladder and exchanged for a second Superstiff wire via the KMP catheter having retaining still Superstiff wire access to the kidney. Percutaneous drape was applied.  Incision of the skin was made at this site for distance of approximately 1.5 cm. The NephroMax balloon dilation apparatus was carefully positioned across this, inflated to a pressure of 20 atmospheres, held for 90 seconds and the sheath carefully advanced under continuous fluoroscopic guidance across at the lower mid calyx.  Rigid nephroscopy was then performed.  This revealed excellent placement of the sheath and into the area of the lower mid calyx and excellent angulation to the area of the UPJ where the very large obstructing stone was seen.  This did appear amenable to rigid stone manipulation.  As such, the rigid lithoclast dual ultrasound pneumatic device was used to apply energy to the stand for approximately 20 minutes fragmenting it into several smaller pieces and removing inherently approximately 50% of stone volume.  The smaller fragments were then amenable to rigid grasping, they were grasped aside for compositional analysis.  Flexible nephroscopy was then performed using a 16-French flexible cystoscope, and all calices were inspected times several.  There were several smaller stones, which were then grasped with Escape basket and removed. The area of the UPJ was very carefully inspected and several small stones removed as well.  Notably, the UPJ did appear patent.  There was coarse mucosal edema at the site of prior stone impaction, but there was no  obvious high-grade obstruction or stricture at this site.  The goal was to verify if it is lateral stone free.  Flexible digital ureteroscope was carefully placed in antegrade fashion using continuous guidance to the level of the distal most ureter, and no stones were encountered larger than 1/3rd mm.  There was no evidence of stricturing.  A separate Sensor wire was advanced down to the level of the ureter after removal of the in situ externalized catheter.  New 6 x 26 contour type stent was placed using nephroscopic and fluoroscopic guidance.  Good proximal and distal deployment were noted.  The sheath was then very carefully removed under continuous vision.  10 mL of Floseal were applied.  The percutaneous tract was then closed at the level of skin using interrupted Vicryl x3.  Dressing was applied. Procedure was terminated.  The patient tolerated the procedure well. There were no immediate periprocedural complications.  The patient was taken to the postanesthesia care in stable condition.          ______________________________ Alexis Frock, MD     TM/MEDQ  D:  09/16/2015  T:  09/17/2015  Job:  [id number] 

## 2015-09-17 NOTE — Progress Notes (Signed)
Foley catheter discontinued at this time per MD order. Patient tolerated well. 420cc of urine removed from foley catheter bag  and patient voided 600cc after foley was removed.

## 2015-09-18 ENCOUNTER — Encounter (HOSPITAL_COMMUNITY): Payer: Self-pay | Admitting: Urology

## 2017-04-30 IMAGING — NM NM RENAL IMAGING FLOW W/ PHARM
2 series · 12 of 12 positions shown · non-contrast
Comparison: None.

CLINICAL DATA: LEFT hydronephrosis.

EXAM:
NUCLEAR MEDICINE RENAL SCAN WITH DIURETIC ADMINISTRATION
TECHNIQUE: Radionuclide angiographic and sequential renal images were obtained
after intravenous injection of radiopharmaceutical. Imaging was
continued during slow intravenous injection of Lasix approximately
15 minutes after the start of the examination.
RADIOPHARMACEUTICALS:  Fifteen mCi Nechnetium-LLm MAG3 IV

[Series 1: renal scan · 4.14mm/px · 6 of 90 frames shown (1 of 2)]
[frame 8/90]
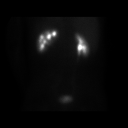
[frame 23/90]
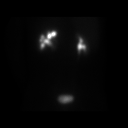
[frame 38/90]
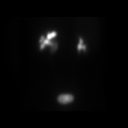
[frame 53/90]
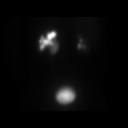
[frame 68/90]
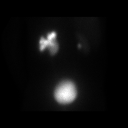
[frame 83/90]
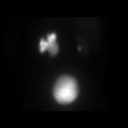

[Series 1: renal scan · 4.14mm/px · 6 of 40 frames shown (2 of 2)]
[frame 4/40  full-range]
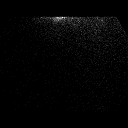
[frame 10/40  full-range]
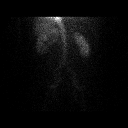
[frame 17/40  full-range]
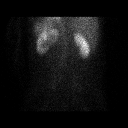
[frame 24/40  full-range]
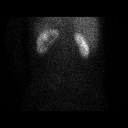
[frame 30/40  full-range]
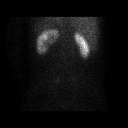
[frame 37/40  full-range]
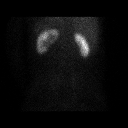

[12 of 12 positions shown; findings below may reference images not displayed]

FINDINGS: Flow:  Prompt symmetric arterial flow to the kidneys.

Left renogram: Slight delay in LEFT renal cortical uptake compared
the RIGHT. There is uniform renal cortical uptake. Counts are
excreted into the collecting system and at a slightly delayed time
compared to the RIGHT. There is pooling within the calices. There is
pooling within the LEFT renal pelvis to the level of the
ureteropelvic junction. There is a large postvoid residual.

Right renogram: Uniform uptake of counts in the renal cortex. Counts
are promptly excreted into the collecting system. Lasix augment
clearance. No postvoid residual.

Differential:

Left kidney = 52 %

Right kidney = 48 %

T1/2 post Lasix :

Left kidney = 16.7 min

Right kidney = 5.3 min
IMPRESSION: 1. Partial obstructive hydronephrosis of the LEFT kidney.
Obstruction at the ureteropelvic junction.
2. Large postvoid residual on the LEFT.
3. Normal RIGHT kidney.

## 2017-05-21 IMAGING — RF DG ABDOMEN 1V
1 series · 13 of 13 positions shown · non-contrast
Comparison: CT 08/26/2015

CLINICAL DATA: Intraoperative percutaneous nephrolithotomy for
kidney stone removal

EXAM:
ABDOMEN - 1 VIEW

[Series 1: run · 13 of 13 slices shown]
[im 1/13]
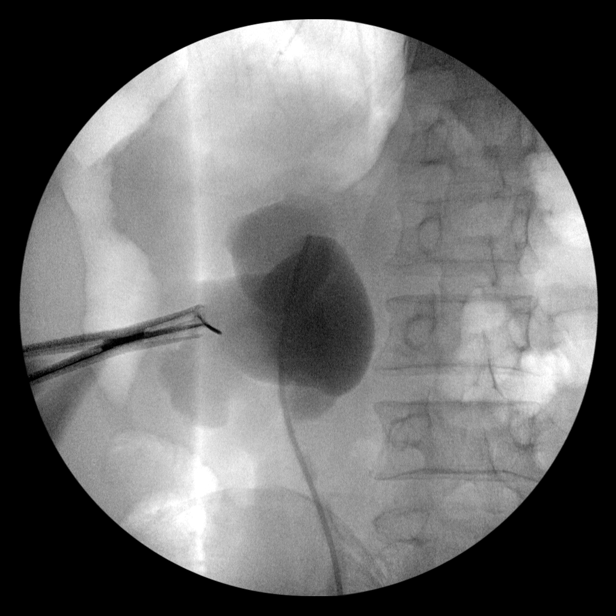
[im 2/13]
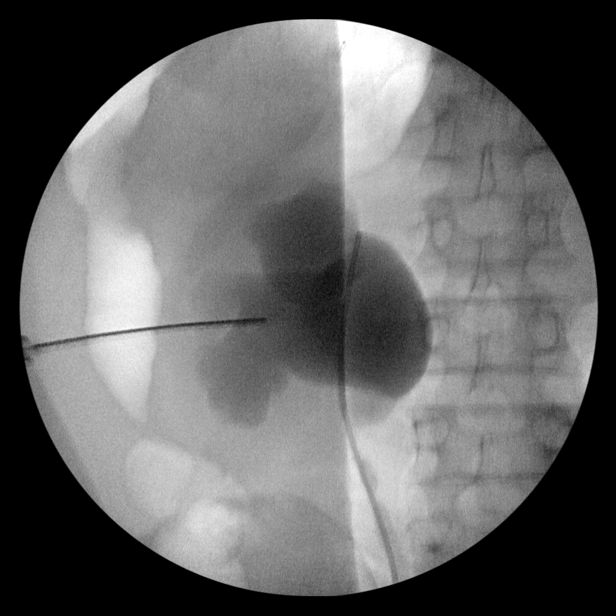
[im 3/13]
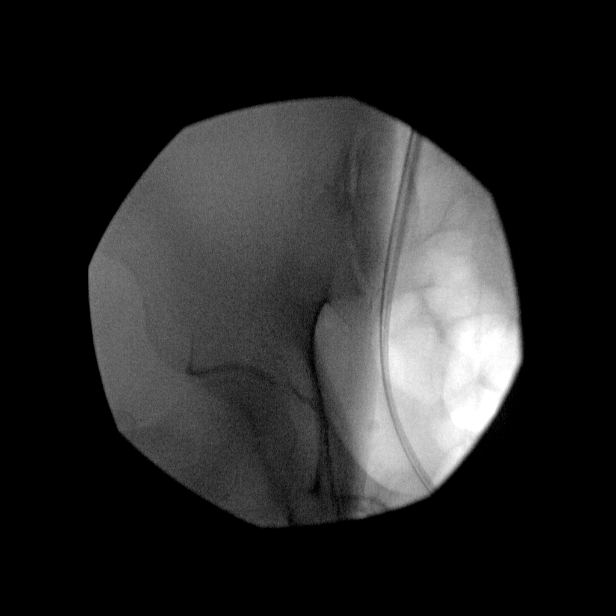
[im 4/13]
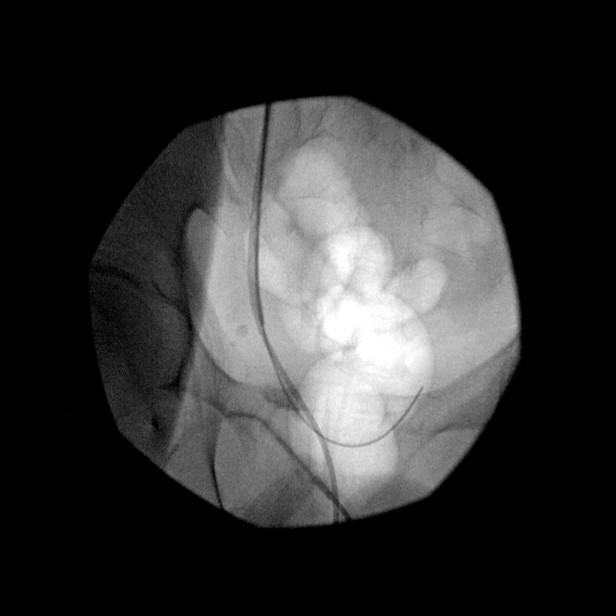
[im 5/13]
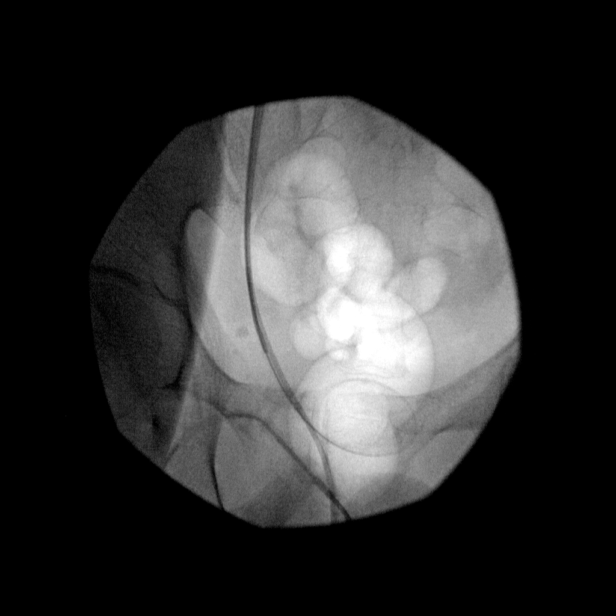
[im 6/13]
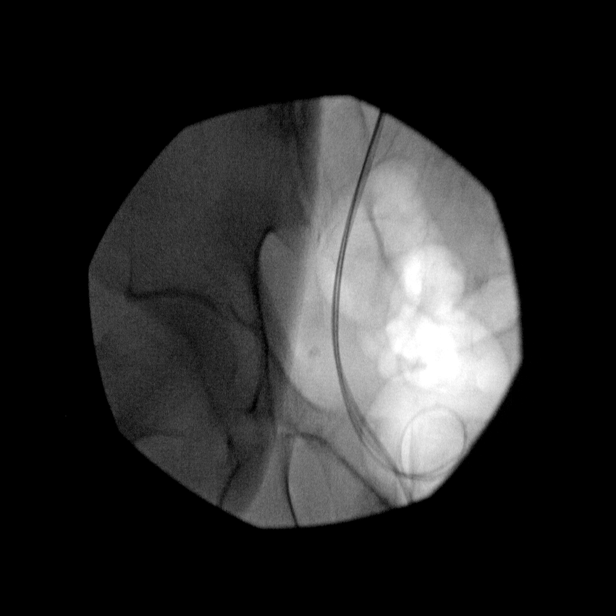
[im 7/13]
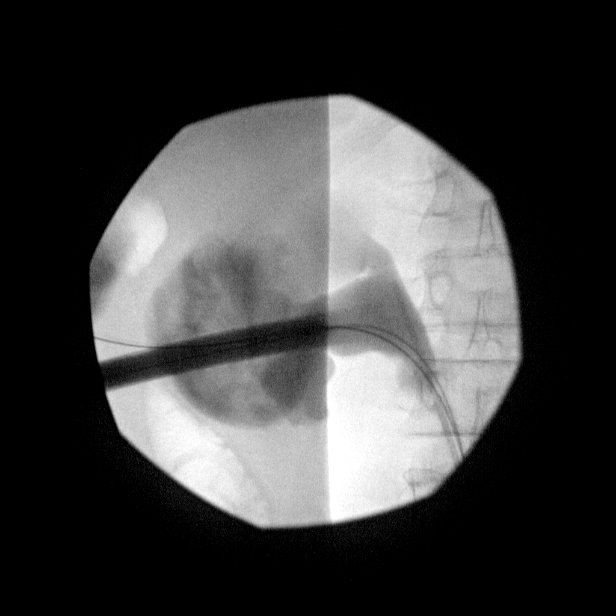
[im 8/13]
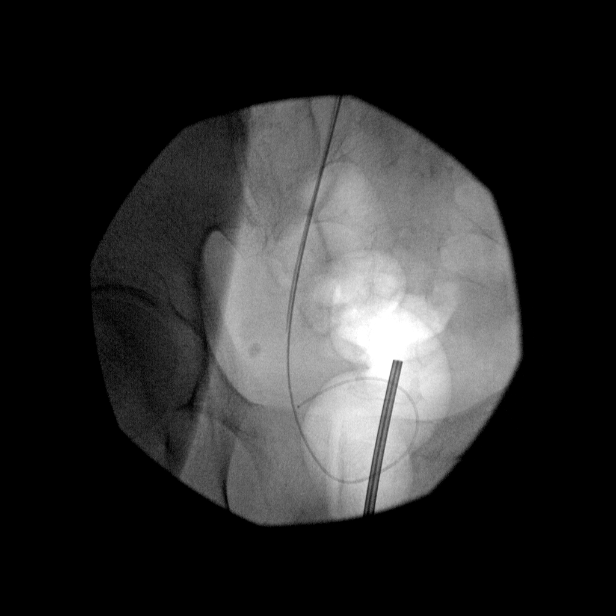
[im 9/13]
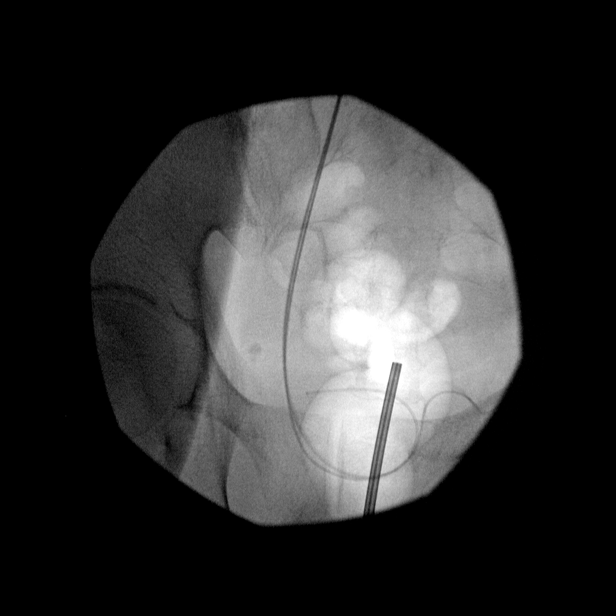
[im 10/13]
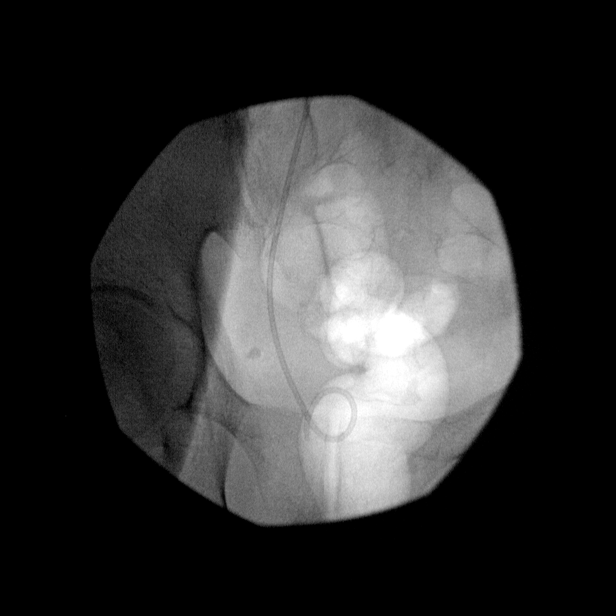
[im 11/13]
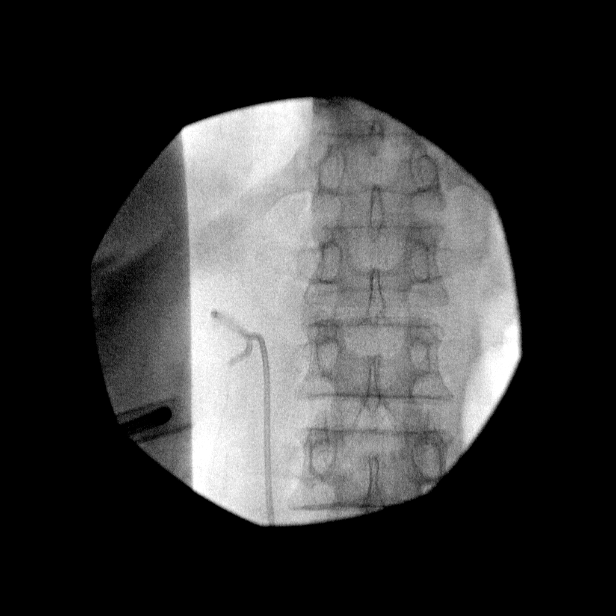
[im 12/13]
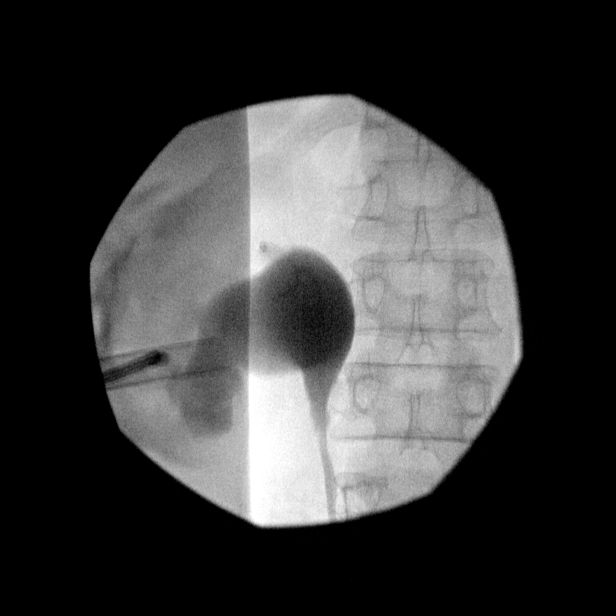
[im 13/13]
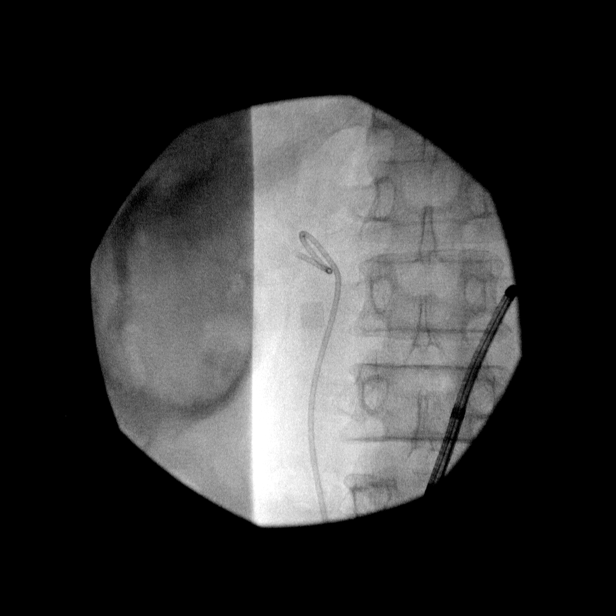

[13 of 13 positions shown; findings below may reference images not displayed]

FINDINGS: Multiple intraoperative spot images demonstrate access of the left
renal collecting system with marked hydronephrosis. Placement of
wires into the bladder. Final image demonstrates placement of left
ureteral stent.
IMPRESSION: Marked left hydronephrosis.  Placement of left ureteral stent.

## 2017-05-21 IMAGING — RF DG RETROGRADE PYELOGRAM
1 series · 3 of 3 positions shown · non-contrast
Comparison: CT abdomen/ pelvis 08/26/2015

CLINICAL DATA: 40-year-old male undergoing retrograde
ureteropyelogram

EXAM:
RETROGRADE PYELOGRAM; DG C-ARM 61-120 MIN-NO REPORT

[Series 1: run · 3 of 3 slices shown]
[im 1/3]
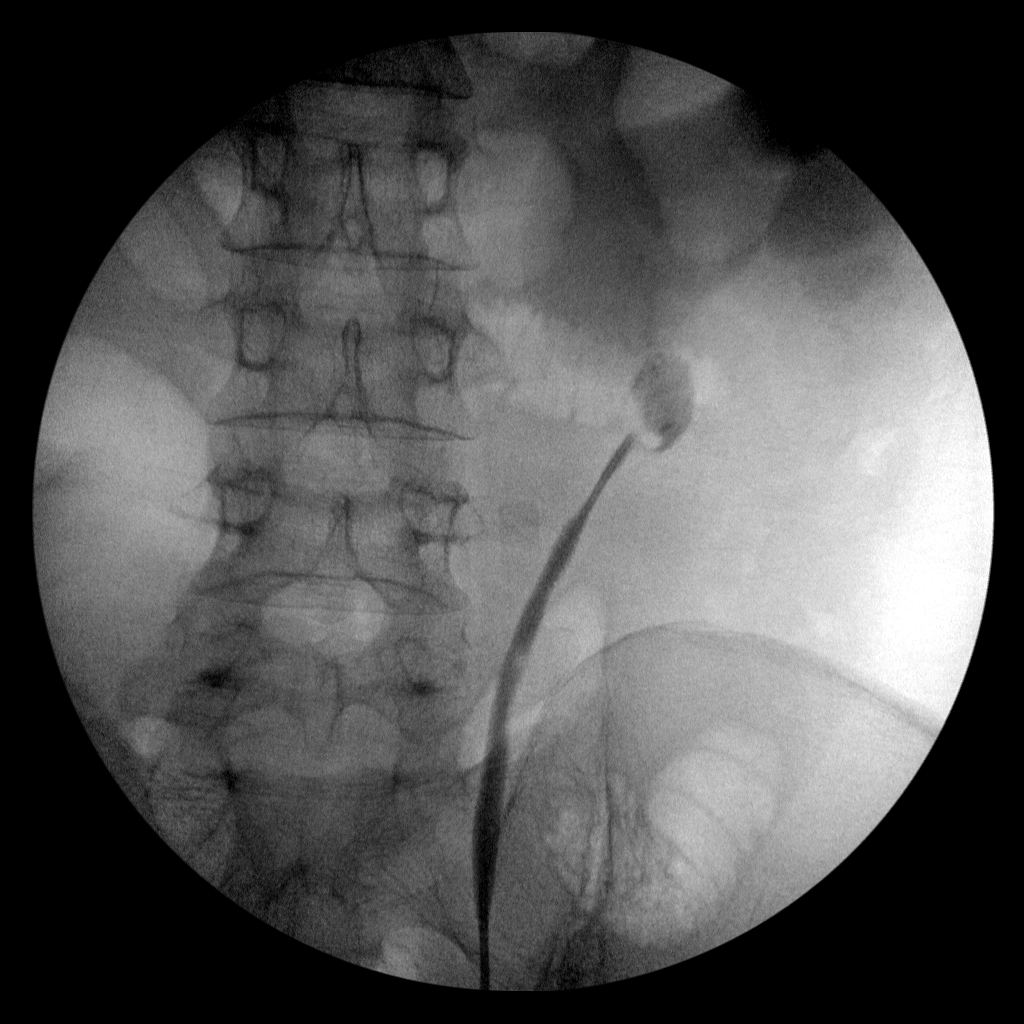
[im 2/3]
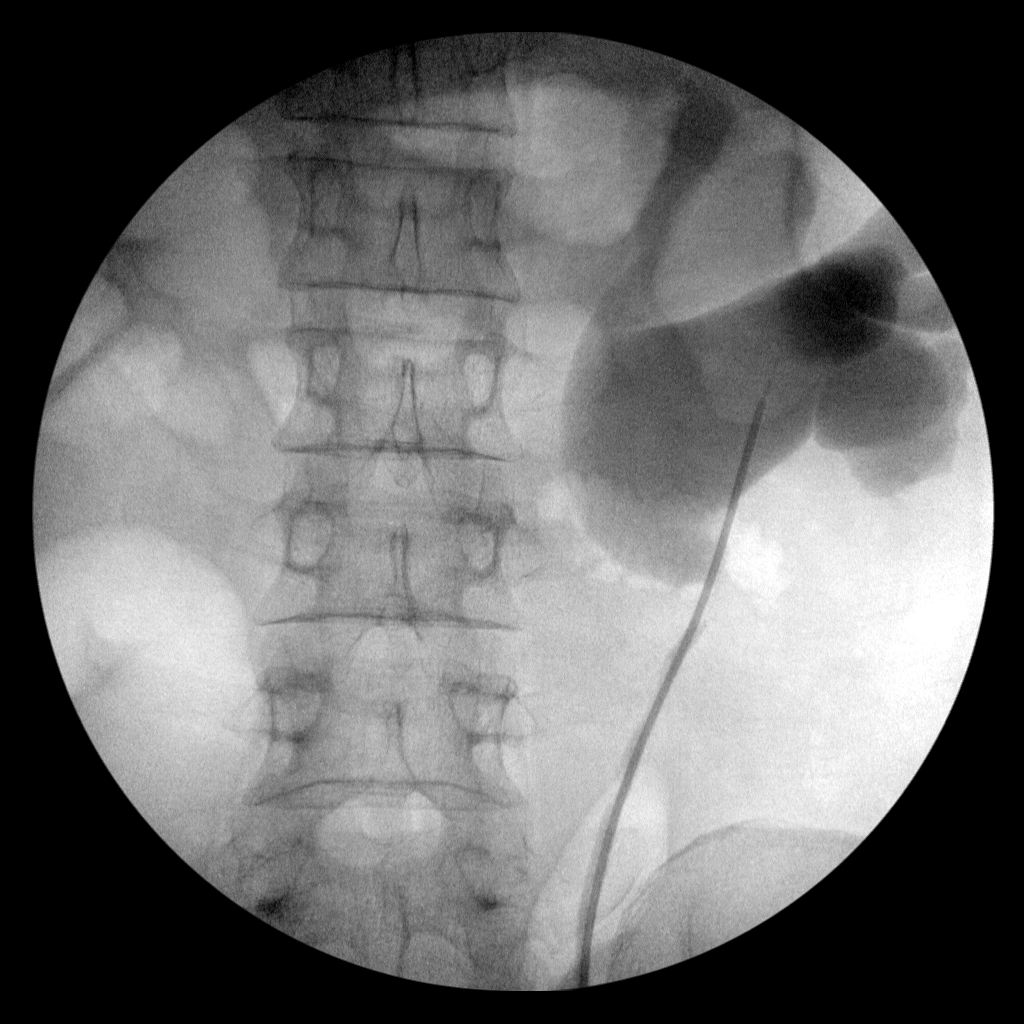
[im 3/3]
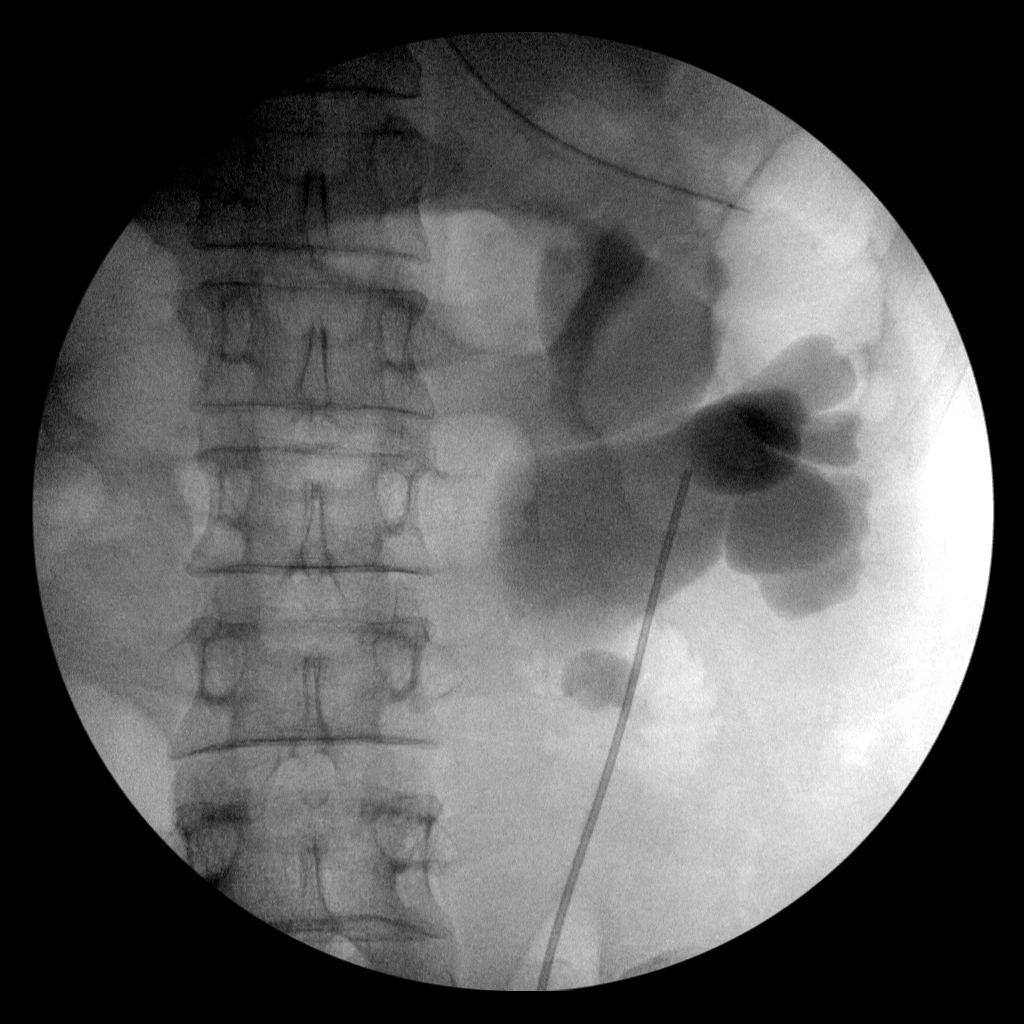

[3 of 3 positions shown; findings below may reference images not displayed]

FINDINGS: Three fluoroscopic spot images demonstrate a radiopaque stone at the
UPJ. Following successful navigation of a catheter and wire passed
the stone contrast injection into the left kidney demonstrates
severe hydronephrosis.
IMPRESSION: Obstructing left UPJ stone with severe hydronephrosis.

## 2017-10-10 IMAGING — US US RENAL
1 series · 14 of 25 positions shown · non-contrast
Comparison: None

CLINICAL DATA: LEFT side kidney stone

EXAM:
RENAL / URINARY TRACT ULTRASOUND COMPLETE

[Series 1: us renal · 0.25mm/px · 14 of 59 slices shown]
[im 1/59]
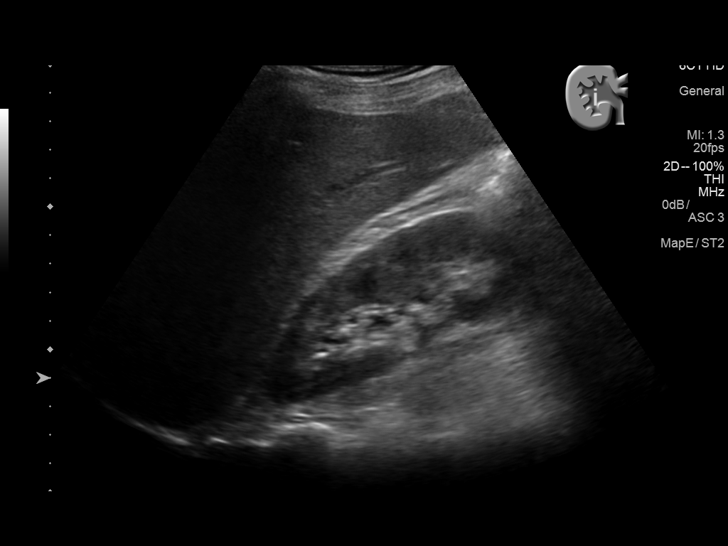
[im 5/59]
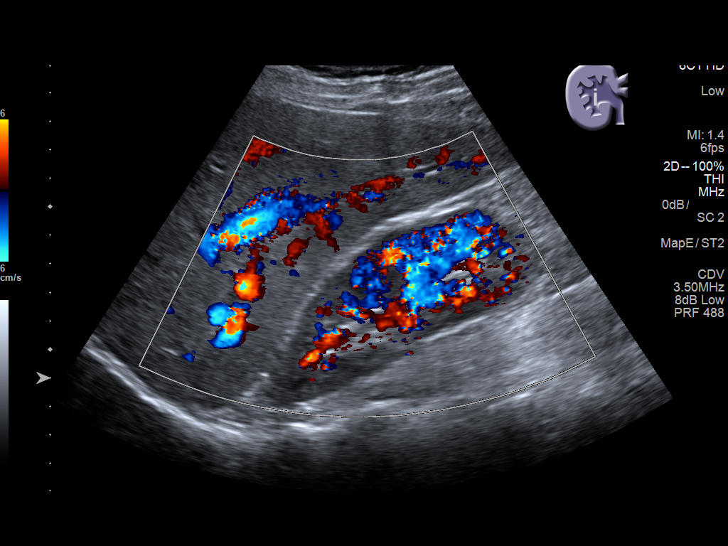
[im 10/59]
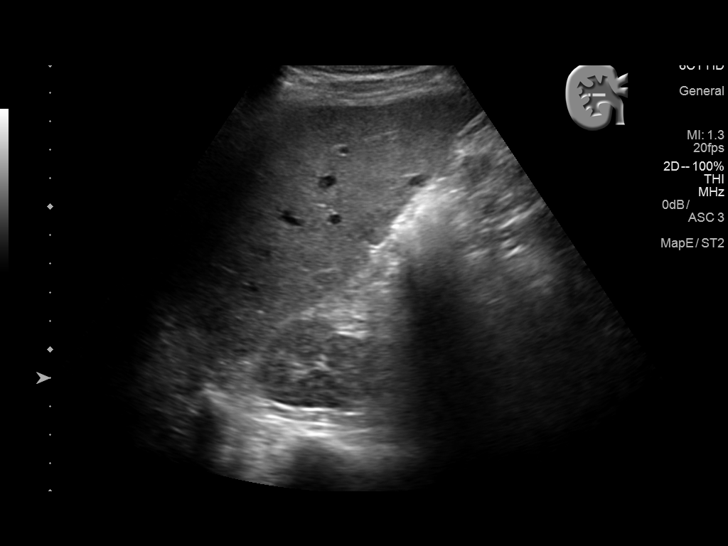
[im 15/59]
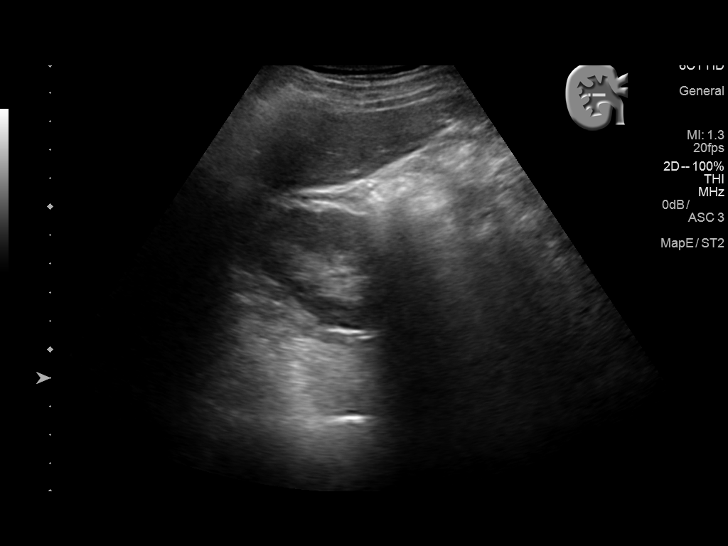
[im 20/59]
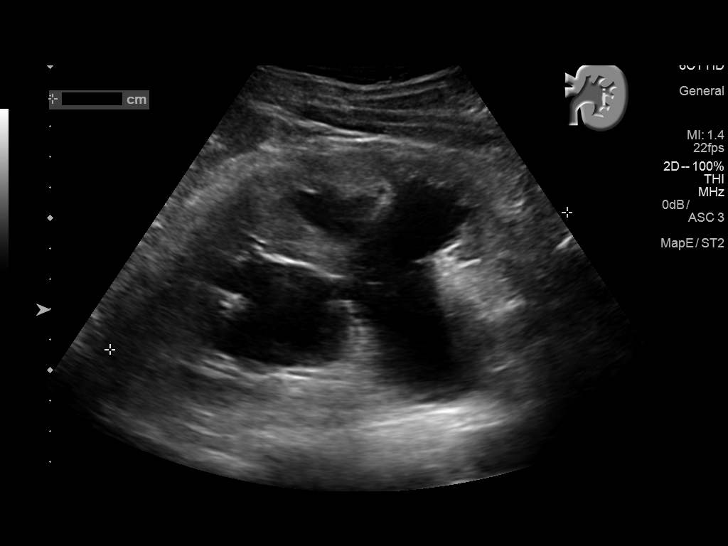
[im 22/59]
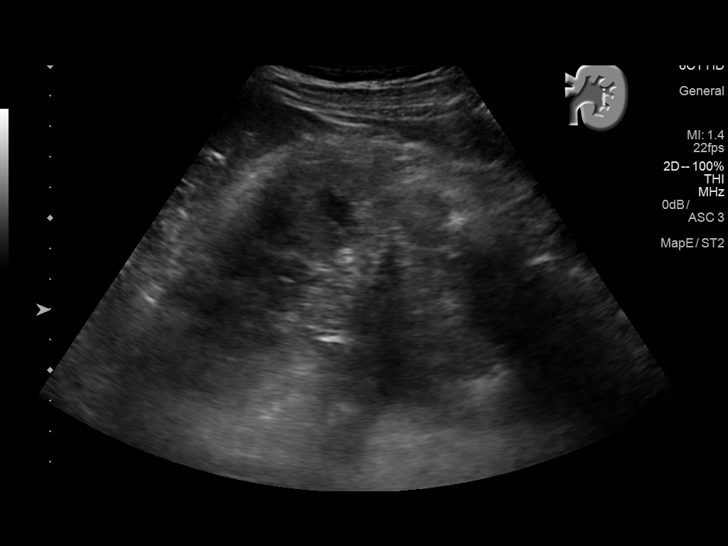
[im 27/59]
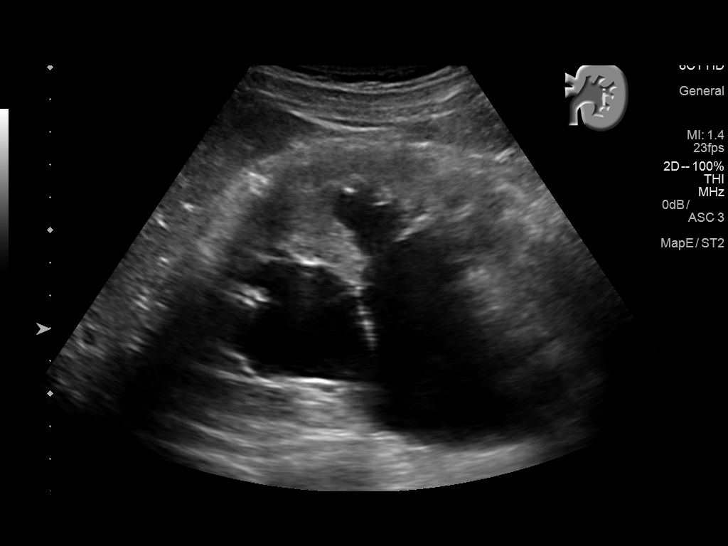
[im 32/59]
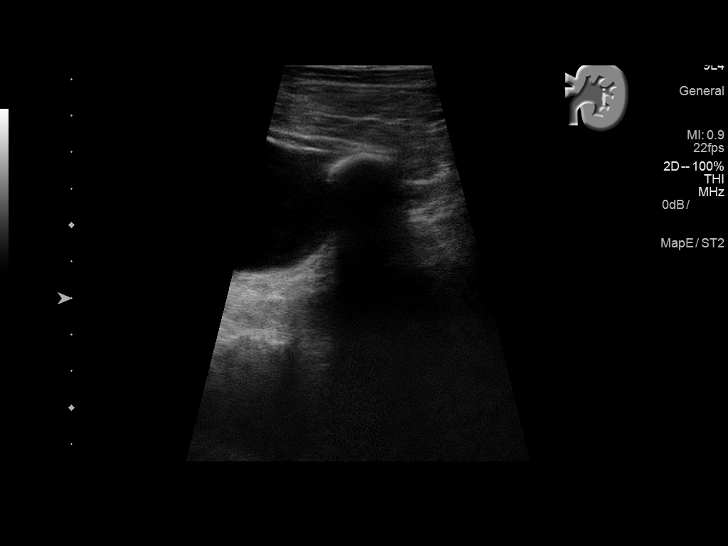
[im 37/59]
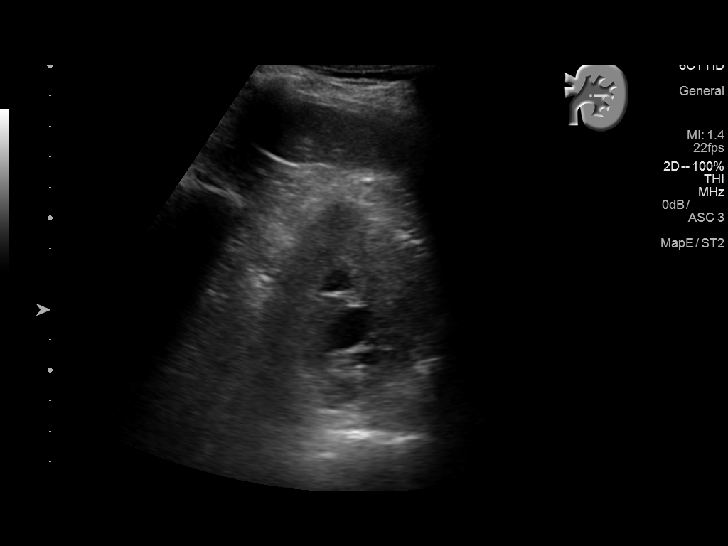
[im 39/59]
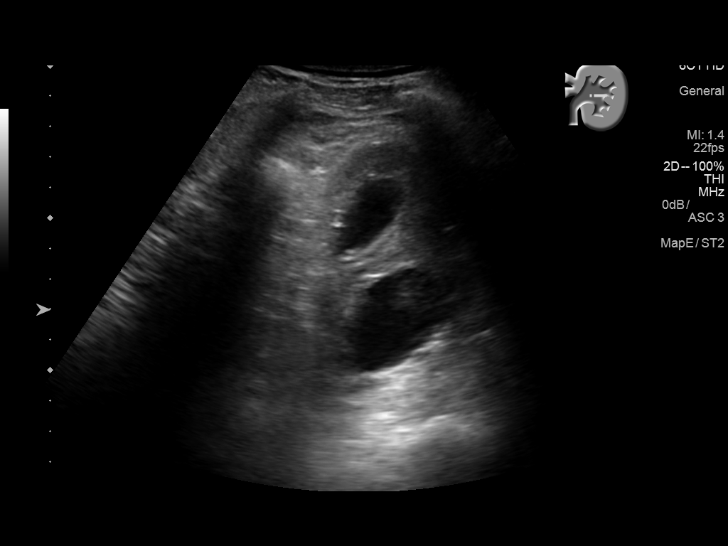
[im 44/59]
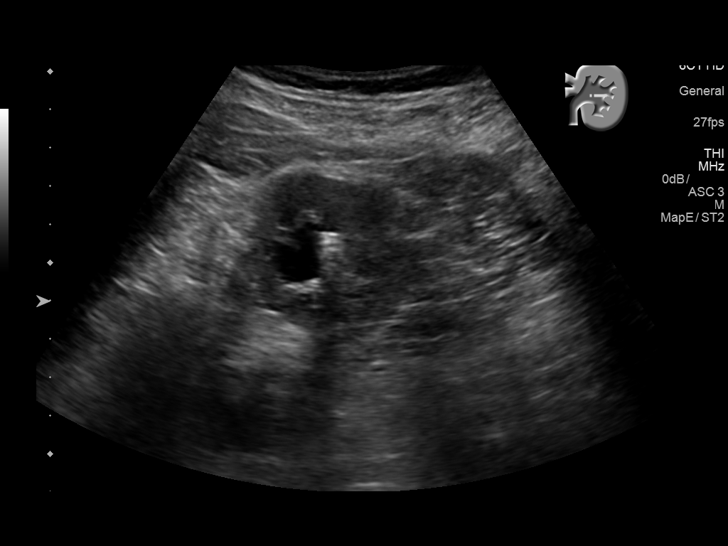
[im 49/59]
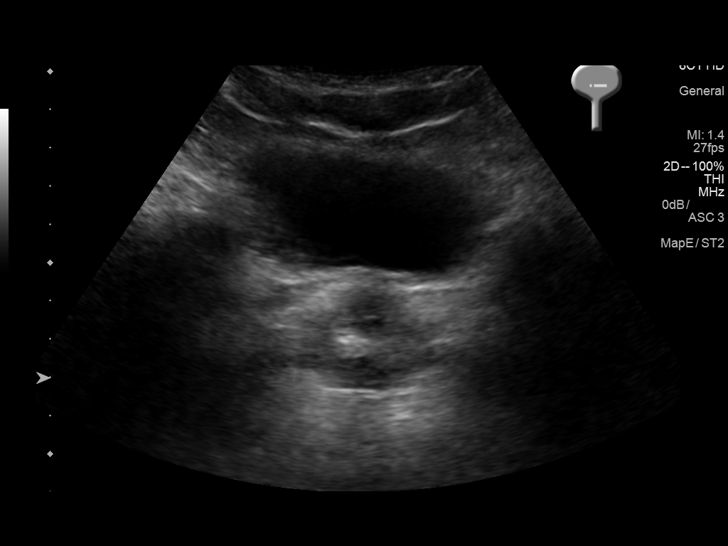
[im 54/59]
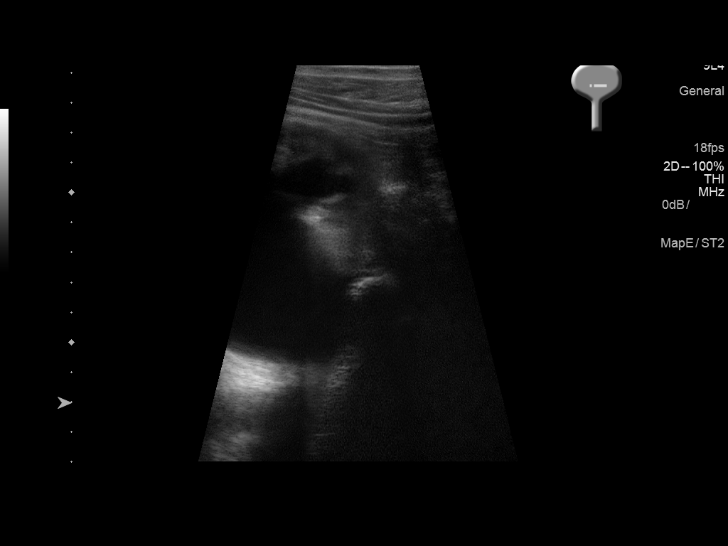
[im 59/59]
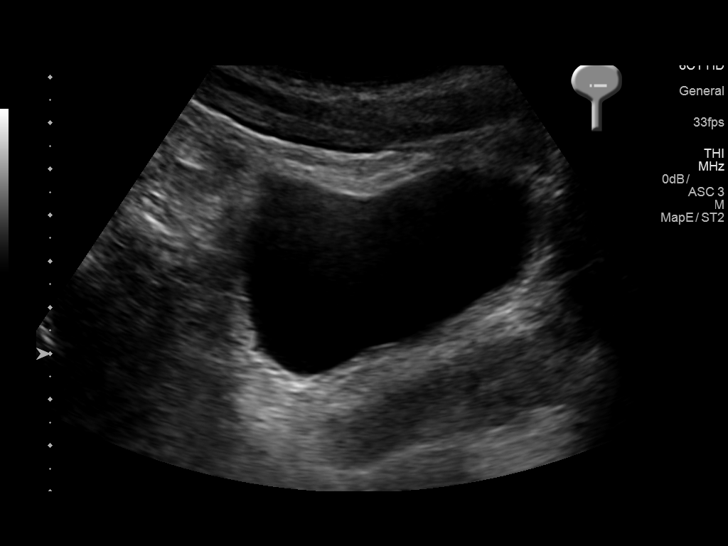

[14 of 25 positions shown; findings below may reference images not displayed]

FINDINGS: Right Kidney:

Length: 10.8 cm. Normal cortical thickness and echogenicity. No
mass, hydronephrosis or shadowing calcification.

Left Kidney:

Length: 15.6 cm. Moderate LEFT hydronephrosis. 18 mm shadowing focus
at ureteropelvic junction versus proximal LEFT ureter consistent
with a large shadowing calculus. No LEFT renal mass identified.

Bladder:

Appears normal for degree of bladder distention. BILATERAL ureteral
jets visualized.
IMPRESSION: LEFT hydronephrosis secondary to an 18 mm shadowing focus at LEFT
ureteropelvic junction versus proximal LEFT ureter.

## 2017-10-18 ENCOUNTER — Ambulatory Visit (INDEPENDENT_AMBULATORY_CARE_PROVIDER_SITE_OTHER): Payer: BLUE CROSS/BLUE SHIELD | Admitting: Family Medicine

## 2017-10-18 ENCOUNTER — Other Ambulatory Visit (HOSPITAL_COMMUNITY)
Admission: RE | Admit: 2017-10-18 | Discharge: 2017-10-18 | Disposition: A | Discharge status: Home / Self Care | Payer: BLUE CROSS/BLUE SHIELD | Source: Ambulatory Visit | Attending: Family Medicine | Admitting: Family Medicine

## 2017-10-18 ENCOUNTER — Encounter: Payer: Self-pay | Admitting: Family Medicine

## 2017-10-18 VITALS — BP 120/70 | HR 63 | Temp 98.3°F | Wt 153.6 lb

## 2017-10-18 DIAGNOSIS — Z0000 Encounter for general adult medical examination without abnormal findings: Secondary | ICD-10-CM

## 2017-10-18 DIAGNOSIS — E785 Hyperlipidemia, unspecified: Secondary | ICD-10-CM

## 2017-10-18 DIAGNOSIS — Z113 Encounter for screening for infections with a predominantly sexual mode of transmission: Secondary | ICD-10-CM

## 2017-10-18 NOTE — Progress Notes (Signed)
Date of Visit: 10/18/2017   HPI:  Patient presents today for a well adult male exam. [place] interpreter utilized during this visit.  Concerns today: none Sexual activity: sexually active with wife STD Screening: desires testing today, denies any symptoms of STDs Exercise: walks constantly, busy at work and also walks for exercise in the evenings Diet: eats healthy Smoking: no Alcohol: no Drugs: no Mood: good, no concerns  Patient is healthy, takes no medications.  ROS: See HPI  [place]:  Cancers in family: mom diagnosed with colon cancer in her 83s in [place]. No other family history of cancers.  PHYSICAL EXAM: BP 120/70 (BP Location: Left Arm, Patient Position: Sitting, Cuff Size: Normal)   Pulse 63   Temp 98.3 F (36.8 C) (Oral)   Wt 153 lb 9.6 oz (69.7 kg)   SpO2 99%   BMI 23.35 kg/m  Gen: NAD, pleasant, cooperative HEENT: NCAT, PERRL, no palpable thyromegaly or anterior cervical lymphadenopathy Heart: RRR, no murmurs Lungs: CTAB, NWOB Abdomen: soft, nontender to palpation Neuro: grossly nonfocal, speech normal GU: not examined  ASSESSMENT/PLAN:  Health maintenance:  -STD screening: HIV, RPR, urine gc/chl/trich obtained today -immunizations: UTD -lipid screening: check lipids & CMET today -colonoscopy: begin screening at age 12 -handout given on health maintenance topics  FOLLOW UP: Follow up in 1 year for next routine physical.  [place] J. Ardelia Mems, [place]

## 2017-10-18 NOTE — Patient Instructions (Signed)

## 2017-10-19 LAB — CMP14+EGFR
ALK PHOS: 75 IU/L (ref 39–117)
ALT: 11 IU/L (ref 0–44)
AST: 16 IU/L (ref 0–40)
Albumin/Globulin Ratio: 1.4 (ref 1.2–2.2)
Albumin: 4.6 g/dL (ref 3.5–5.5)
BILIRUBIN TOTAL: 0.7 mg/dL (ref 0.0–1.2)
BUN/Creatinine Ratio: 17 (ref 9–20)
BUN: 17 mg/dL (ref 6–24)
CHLORIDE: 101 mmol/L (ref 96–106)
CO2: 25 mmol/L (ref 20–29)
CREATININE: 1.01 mg/dL (ref 0.76–1.27)
Calcium: 9.4 mg/dL (ref 8.7–10.2)
GFR calc Af Amer: 106 mL/min/{1.73_m2} (ref >59)
GFR calc non Af Amer: 91 mL/min/{1.73_m2} (ref >59)
GLOBULIN, TOTAL: 3.2 g/dL (ref 1.5–4.5)
GLUCOSE: 114 mg/dL — AB (ref 65–99)
Potassium: 4.4 mmol/L (ref 3.5–5.2)
SODIUM: 140 mmol/L (ref 134–144)
Total Protein: 7.8 g/dL (ref 6.0–8.5)

## 2017-10-19 LAB — LIPID PANEL
Chol/HDL Ratio: 4.3 ratio (ref 0.0–5.0)
Cholesterol, Total: 255 mg/dL — ABNORMAL HIGH (ref 100–199)
HDL: 59 mg/dL (ref >39)
LDL Calculated: 175 mg/dL — ABNORMAL HIGH (ref 0–99)
Triglycerides: 104 mg/dL (ref 0–149)
VLDL CHOLESTEROL CAL: 21 mg/dL (ref 5–40)

## 2017-10-19 LAB — RPR: RPR: NONREACTIVE

## 2017-10-19 LAB — HIV ANTIBODY (ROUTINE TESTING W REFLEX): HIV Screen 4th Generation wRfx: NONREACTIVE

## 2017-10-21 LAB — URINE CYTOLOGY ANCILLARY ONLY
CHLAMYDIA, DNA PROBE: NEGATIVE
NEISSERIA GONORRHEA: NEGATIVE
TRICH (WINDOWPATH): NEGATIVE

## 2017-10-23 ENCOUNTER — Telehealth: Payer: Self-pay | Admitting: Family Medicine

## 2017-10-23 ENCOUNTER — Encounter: Payer: Self-pay | Admitting: Family Medicine

## 2017-10-23 DIAGNOSIS — R7301 Impaired fasting glucose: Secondary | ICD-10-CM

## 2017-10-23 NOTE — Telephone Encounter (Signed)
Red team, Please call patient & let him know:  - his cholesterol is high, but not high enough to need medication. He should exercise and work on low fat diet to help manage cholesterol.  - blood sugar was a little high. I'd like him to get a hemoglobin A1c to test for diabetes. I have ordered this, and he can schedule a lab visit at his convenience.  I am happy to speak with him if he has questions.  Thanks! Leeanne Rio, MD

## 2017-10-24 NOTE — Telephone Encounter (Signed)
LMOVM using [place] interpretor patrick [phone]) telling pt to call us back . Liley Rake Kennon Holter, CMA

## 2018-04-23 ENCOUNTER — Ambulatory Visit: Payer: Self-pay | Admitting: Student in an Organized Health Care Education/Training Program

## 2018-04-23 ENCOUNTER — Ambulatory Visit (HOSPITAL_COMMUNITY)
Admission: EM | Admit: 2018-04-23 | Discharge: 2018-04-23 | Disposition: A | Discharge status: Home / Self Care | Payer: BLUE CROSS/BLUE SHIELD | Attending: Internal Medicine | Admitting: Internal Medicine

## 2018-04-23 ENCOUNTER — Encounter (HOSPITAL_COMMUNITY): Payer: Self-pay | Admitting: Emergency Medicine

## 2018-04-23 DIAGNOSIS — H109 Unspecified conjunctivitis: Secondary | ICD-10-CM

## 2018-04-23 MED ORDER — ERYTHROMYCIN 5 MG/GM OP OINT: TOPICAL_OINTMENT | OPHTHALMIC | 0 refills | Status: DC

## 2018-04-23 MED ORDER — FLUORESCEIN SODIUM 1 MG OP STRP
ORAL_STRIP | OPHTHALMIC | Status: AC
  Filled 2018-04-23: qty 1

## 2018-04-23 NOTE — ED Triage Notes (Signed)
Pt presents to [facility] for assessment of 3 days of left eye redness and pain.  Required [place] interpreter

## 2018-04-23 NOTE — Discharge Instructions (Signed)
If you get worse comes.

## 2018-04-23 NOTE — ED Provider Notes (Signed)
[place]    CSN: [id number] Arrival date & time: 04/23/18  1518     History   Chief Complaint Chief Complaint  Patient presents with  . Eye Pain    HPI John Winters is a 43 y.o. male. with whom a translator was used.   Onset of redness of his L eye 3 days ago, it started with the R eye last week, but this one feels better though it is still a little red. Denies FB sensation. His eye feels paiful. Has been tearing and draining puss. The light makes his L eye tear, but no pain. Denies vision changes, HA, rhinitis.  He does not wear contacts.     Past Medical History:  Diagnosis Date  . GERD (gastroesophageal reflux disease)   . Headache     Patient Active Problem List   Diagnosis Date Noted  . Renal calculus 09/17/2015  . Left nephrolithiasis 09/16/2015  . Hyperlipidemia 06/27/2015  . Neck pain 06/23/2015  . Kidney stone on left side 06/23/2015  . Lightheadedness 06/23/2015    Past Surgical History:  Procedure Laterality Date  . CYSTOSCOPY W/ URETERAL STENT PLACEMENT Left 09/16/2015   Procedure: CYSTOSCOPY WITH LEFT RETROGRADE PYELOGRAM LEFT URETERAL STENT PLACEMENT;  Surgeon: Alexis Frock, MD;  Location: [facility];  Service: Urology;  Laterality: Left;  . NEPHROLITHOTOMY Left 09/16/2015   Procedure: LEFT PERCUTANEOUS NEPHROLITHOTOMY WITH SURGEON ACCESS ;  Surgeon: Alexis Frock, MD;  Location: [facility];  Service: Urology;  Laterality: Left;  With LASER  . removed tumor from skin     . right breast sugery Right    gynecomastia       Home Medications    Prior to Admission medications   Medication Sig Start Date End Date Taking? Authorizing Provider  erythromycin ophthalmic ointment Place a 1/2 inch ribbon of ointment into the lower eyelid tid x 7 days in each eye 04/23/18   Rodriguez-Southworth, Sunday Spillers, PA-C    Family History Family History  Problem Relation Age of Onset  . Hyperlipidemia Father     Social History Social History   Tobacco Use    . Smoking status: Never Smoker  . Smokeless tobacco: Never Used  Substance Use Topics  . Alcohol use: Yes    Alcohol/week: 2.0 standard drinks    Types: 2 Standard drinks or equivalent per week    Comment: socially   . Drug use: No     Allergies   Patient has no known allergies.   Review of Systems Review of Systems  Constitutional: Negative for chills, diaphoresis and fever.  HENT: Negative for congestion, postnasal drip and rhinorrhea.   Eyes: Positive for pain, discharge, redness and itching. Negative for photophobia and visual disturbance.  Musculoskeletal: Negative for myalgias.  Skin: Negative for rash and wound.  Neurological: Negative for numbness.  Hematological: Negative for adenopathy.     Physical Exam Triage Vital Signs ED Triage Vitals  Enc Vitals Group     BP 04/23/18 1559 (!) 133/93     Pulse Rate 04/23/18 1559 67     Resp 04/23/18 1559 16     Temp 04/23/18 1559 98.2 F (36.8 C)     Temp Source 04/23/18 1559 Oral     SpO2 04/23/18 1559 100 %     Weight --      Height --      Head Circumference --      Peak Flow --      Pain Score 04/23/18 1602 7  Pain Loc --      Pain Edu? --      Excl. in [place]? --    No data found.  Updated Vital Signs BP (!) 133/93 (BP Location: Left Arm)   Pulse 67   Temp 98.2 F (36.8 C) (Oral)   Resp 16   SpO2 100%   Visual Acuity Right Eye Distance:   Left Eye Distance:   Bilateral Distance:    Right Eye Near:   Left Eye Near:    Bilateral Near:     Physical Exam Vitals signs and nursing note reviewed.  Constitutional:      Appearance: Normal appearance. He is normal weight.  HENT:     Head: Normocephalic and atraumatic.     Right Ear: External ear normal.     Left Ear: External ear normal.     Nose: Nose normal.  Eyes:     General: No scleral icterus.       Left eye: Discharge present.    Extraocular Movements: Extraocular movements intact.     Pupils: Pupils are equal, round, and reactive to  light.     Comments: Both scleras and mildly injected, L > R. Has yellow matter from the L eye. Lids are normal bilaterally.   Neck:     Musculoskeletal: Neck supple.  Pulmonary:     Effort: Pulmonary effort is normal.  Musculoskeletal: Normal range of motion.  Lymphadenopathy:     Cervical: No cervical adenopathy.  Skin:    General: Skin is warm and dry.     Findings: No erythema, lesion or rash.  Neurological:     Mental Status: He is alert and oriented to person, place, and time.     Gait: Gait normal.  Psychiatric:        Mood and Affect: Mood normal.        Behavior: Behavior normal.        Thought Content: Thought content normal.        Judgment: Judgment normal.    UC Treatments / Results  Labs (all labs ordered are listed, but only abnormal results are displayed) Labs Reviewed - No data to display  EKG None  Radiology No results found.  Procedures Fluorecine stain was negative for corneal abrasion, FB, dendritic pattern on L eye.  Medications Ordered in UC Medications - No data to display  Initial Impression / Assessment and Plan / UC Course  I have reviewed the triage vital signs and the nursing notes. I believe he has bacterial conjunctivitis of the L eye, and may have started as viral on the R side, but this was does not have purulent matter. I placed him on erythromycin eye ointment to use into each eye tid x 7 days. I educated him how to use it. I answered all his questions and note for work was given.   Final Clinical Impressions(s) / UC Diagnoses   Final diagnoses:  Conjunctivitis, bacterial     Discharge Instructions     If you get worse comes.     ED Prescriptions    Medication Sig Dispense Auth. Provider   erythromycin ophthalmic ointment Place a 1/2 inch ribbon of ointment into the lower eyelid tid x 7 days in each eye 3.5 g Rodriguez-Southworth, Sunday Spillers, PA-C     Controlled Substance Prescriptions [place] Controlled Substance Registry  consulted?    Shelby Mattocks, [place] 04/23/18 1647

## 2018-04-23 NOTE — ED Notes (Signed)
Waiting for interpreter to continue with triage

## 2018-12-03 DIAGNOSIS — Z23 Encounter for immunization: Secondary | ICD-10-CM | POA: Diagnosis not present

## 2019-12-11 DIAGNOSIS — Z23 Encounter for immunization: Secondary | ICD-10-CM | POA: Diagnosis not present

## 2020-02-16 ENCOUNTER — Ambulatory Visit (INDEPENDENT_AMBULATORY_CARE_PROVIDER_SITE_OTHER): Payer: BC Managed Care – PPO | Admitting: Family Medicine

## 2020-02-16 ENCOUNTER — Other Ambulatory Visit: Payer: Self-pay

## 2020-02-16 VITALS — BP 105/65 | HR 67 | Ht 66.0 in | Wt 154.0 lb

## 2020-02-16 DIAGNOSIS — R7301 Impaired fasting glucose: Secondary | ICD-10-CM | POA: Diagnosis not present

## 2020-02-16 DIAGNOSIS — Z23 Encounter for immunization: Secondary | ICD-10-CM | POA: Diagnosis not present

## 2020-02-16 DIAGNOSIS — Z1159 Encounter for screening for other viral diseases: Secondary | ICD-10-CM | POA: Diagnosis not present

## 2020-02-16 DIAGNOSIS — E785 Hyperlipidemia, unspecified: Secondary | ICD-10-CM

## 2020-02-16 DIAGNOSIS — Z0000 Encounter for general adult medical examination without abnormal findings: Secondary | ICD-10-CM | POA: Diagnosis not present

## 2020-02-16 LAB — POCT GLYCOSYLATED HEMOGLOBIN (HGB A1C): Hemoglobin A1C: 5.4 % (ref 4.0–5.6)

## 2020-02-16 NOTE — Progress Notes (Signed)
  Date of Visit: 02/16/2020   SUBJECTIVE:   HPI:  John Winters presents today for a well adult male exam.   Concerns today: none STD Screening: declines concern for STDs Exercise: no regular exercise, counseled, stays busy with work Smoking: no Alcohol: one drink on weekends Drugs: no Mood: no concerns Cancers in family: no  OBJECTIVE:   BP 105/65   Pulse 67   Ht 5\' 6"  (1.676 m)   Wt 154 lb (69.9 kg)   SpO2 99%   BMI 24.86 kg/m  Gen: NAD, pleasant, cooperative HEENT: NCAT, PERRL, no palpable thyromegaly or anterior cervical lymphadenopathy Heart: RRR, no murmurs Lungs: CTAB, NWOB Abdomen: soft, nontender to palpation Neuro: grossly nonfocal, speech normal, 2+ patellar reflexes, no edema  ASSESSMENT/PLAN:   Health maintenance:  -immunizations: . Flu: current . Tdap: current . COVID: booster given today -lipid screening: check lipids today -diabetes screening: check A1c today given history of elevated glucose -hep C antibody drawn today -colon cancer screening: discussed that we now recommend beginning at 39 -handout given on health maintenance topics  FOLLOW UP: Follow up in 1 year for next CPE  John Winters, [place]

## 2020-02-16 NOTE — Patient Instructions (Addendum)
Follow up in 1 year, sooner if needed   Health Maintenance, Male Adopting a healthy lifestyle and getting preventive care are important in promoting health and wellness. Ask your health care provider about:  The right schedule for you to have regular tests and exams.  Things you can do on your own to prevent diseases and keep yourself healthy. What should I know about diet, weight, and exercise? Eat a healthy diet   Eat a diet that includes plenty of vegetables, fruits, low-fat dairy products, and lean protein.  Do not eat a lot of foods that are high in solid fats, added sugars, or sodium. Maintain a healthy weight Body mass index (BMI) is a measurement that can be used to identify possible weight problems. It estimates body fat based on height and weight. Your health care provider can help determine your BMI and help you achieve or maintain a healthy weight. Get regular exercise Get regular exercise. This is one of the most important things you can do for your health. Most adults should:  Exercise for at least 150 minutes each week. The exercise should increase your heart rate and make you sweat (moderate-intensity exercise).  Do strengthening exercises at least twice a week. This is in addition to the moderate-intensity exercise.  Spend less time sitting. Even light physical activity can be beneficial. Watch cholesterol and blood lipids Have your blood tested for lipids and cholesterol at 44 years of age, then have this test every 5 years. You may need to have your cholesterol levels checked more often if:  Your lipid or cholesterol levels are high.  You are older than 44 years of age.  You are at high risk for heart disease. What should I know about cancer screening? Many types of cancers can be detected early and may often be prevented. Depending on your health history and family history, you may need to have cancer screening at various ages. This may include screening  for:  Colorectal cancer.  Prostate cancer.  Skin cancer.  Lung cancer. What should I know about heart disease, diabetes, and high blood pressure? Blood pressure and heart disease  High blood pressure causes heart disease and increases the risk of stroke. This is more likely to develop in people who have high blood pressure readings, are of African descent, or are overweight.  Talk with your health care provider about your target blood pressure readings.  Have your blood pressure checked: ? Every 3-5 years if you are 87-70 years of age. ? Every year if you are 10 years old or older.  If you are between the ages of 68 and 13 and are a current or former smoker, ask your health care provider if you should have a one-time screening for abdominal aortic aneurysm (AAA). Diabetes Have regular diabetes screenings. This checks your fasting blood sugar level. Have the screening done:  Once every three years after age 68 if you are at a normal weight and have a low risk for diabetes.  More often and at a younger age if you are overweight or have a high risk for diabetes. What should I know about preventing infection? Hepatitis B If you have a higher risk for hepatitis B, you should be screened for this virus. Talk with your health care provider to find out if you are at risk for hepatitis B infection. Hepatitis C Blood testing is recommended for:  Everyone born from [birth year] through 1965.  Anyone with known risk factors for hepatitis C. Sexually  transmitted infections (STIs)  You should be screened each year for STIs, including gonorrhea and chlamydia, if: ? You are sexually active and are younger than 44 years of age. ? You are older than 44 years of age and your health care provider tells you that you are at risk for this type of infection. ? Your sexual activity has changed since you were last screened, and you are at increased risk for chlamydia or gonorrhea. Ask your health care  provider if you are at risk.  Ask your health care provider about whether you are at high risk for HIV. Your health care provider may recommend a prescription medicine to help prevent HIV infection. If you choose to take medicine to prevent HIV, you should first get tested for HIV. You should then be tested every 3 months for as long as you are taking the medicine. Follow these instructions at home: Lifestyle  Do not use any products that contain nicotine or tobacco, such as cigarettes, e-cigarettes, and chewing tobacco. If you need help quitting, ask your health care provider.  Do not use street drugs.  Do not share needles.  Ask your health care provider for help if you need support or information about quitting drugs. Alcohol use  Do not drink alcohol if your health care provider tells you not to drink.  If you drink alcohol: ? Limit how much you have to 0-2 drinks a day. ? Be aware of how much alcohol is in your drink. In the [place] one drink equals one 12 oz bottle of beer (355 mL), one 5 oz glass of wine (148 mL), or one 1 oz glass of hard liquor (44 mL). General instructions  Schedule regular health, dental, and eye exams.  Stay current with your vaccines.  Tell your health care provider if: ? You often feel depressed. ? You have ever been abused or do not feel safe at home. Summary  Adopting a healthy lifestyle and getting preventive care are important in promoting health and wellness.  Follow your health care provider's instructions about healthy diet, exercising, and getting tested or screened for diseases.  Follow your health care provider's instructions on monitoring your cholesterol and blood pressure. This information is not intended to replace advice given to you by your health care provider. Make sure you discuss any questions you have with your health care provider. Document Revised: 02/26/2018 Document Reviewed: 02/26/2018 Elsevier Patient Education  2020  [employer].

## 2020-02-17 LAB — CMP14+EGFR
ALT: 30 IU/L (ref 0–44)
AST: 19 IU/L (ref 0–40)
Albumin/Globulin Ratio: 1.3 (ref 1.2–2.2)
Albumin: 4.7 g/dL (ref 4.0–5.0)
Alkaline Phosphatase: 75 IU/L (ref 44–121)
BUN/Creatinine Ratio: 27 — ABNORMAL HIGH (ref 9–20)
BUN: 23 mg/dL (ref 6–24)
Bilirubin Total: 0.5 mg/dL (ref 0.0–1.2)
CO2: 25 mmol/L (ref 20–29)
Calcium: 9.5 mg/dL (ref 8.7–10.2)
Chloride: 103 mmol/L (ref 96–106)
Creatinine, Ser: 0.85 mg/dL (ref 0.76–1.27)
GFR calc Af Amer: 123 mL/min/{1.73_m2} (ref >59)
GFR calc non Af Amer: 106 mL/min/{1.73_m2} (ref >59)
Globulin, Total: 3.5 g/dL (ref 1.5–4.5)
Glucose: 101 mg/dL — ABNORMAL HIGH (ref 65–99)
Potassium: 5.5 mmol/L — ABNORMAL HIGH (ref 3.5–5.2)
Sodium: 140 mmol/L (ref 134–144)
Total Protein: 8.2 g/dL (ref 6.0–8.5)

## 2020-02-17 LAB — LIPID PANEL
Chol/HDL Ratio: 4.4 ratio (ref 0.0–5.0)
Cholesterol, Total: 280 mg/dL — ABNORMAL HIGH (ref 100–199)
HDL: 63 mg/dL (ref >39)
LDL Chol Calc (NIH): 201 mg/dL — ABNORMAL HIGH (ref 0–99)
Triglycerides: 95 mg/dL (ref 0–149)
VLDL Cholesterol Cal: 16 mg/dL (ref 5–40)

## 2020-02-17 LAB — HCV AB W REFLEX TO QUANT PCR: HCV Ab: <0.1 s/co ratio (ref 0.0–0.9)

## 2020-02-17 LAB — HCV INTERPRETATION

## 2020-02-19 ENCOUNTER — Telehealth: Payer: Self-pay | Admitting: Family Medicine

## 2020-02-19 DIAGNOSIS — E875 Hyperkalemia: Secondary | ICD-10-CM

## 2020-02-19 MED ORDER — ATORVASTATIN CALCIUM 10 MG PO TABS: 10.0000 mg | ORAL_TABLET | Freq: Every day | ORAL | 3 refills | Status: DC

## 2020-02-19 NOTE — Telephone Encounter (Signed)
Called patient to discuss lab results using vietnamese phone interpreter   K elevated - suspect may have been hemolyzed as patient was well and is on no medications, will repeat this on Monday, lab visit scheduled LDL quite elevated at 201. He is agreeable to taking statin. rx sent in. Advised to schedule return office visit in 3 months to recheck LDL  Leeanne Rio, MD

## 2020-02-22 ENCOUNTER — Other Ambulatory Visit: Payer: Self-pay

## 2020-02-22 ENCOUNTER — Other Ambulatory Visit: Payer: BC Managed Care – PPO

## 2020-02-22 DIAGNOSIS — E875 Hyperkalemia: Secondary | ICD-10-CM

## 2020-02-23 LAB — BASIC METABOLIC PANEL
BUN/Creatinine Ratio: 19 (ref 9–20)
BUN: 17 mg/dL (ref 6–24)
CO2: 24 mmol/L (ref 20–29)
Calcium: 8.8 mg/dL (ref 8.7–10.2)
Chloride: 101 mmol/L (ref 96–106)
Creatinine, Ser: 0.9 mg/dL (ref 0.76–1.27)
GFR calc Af Amer: 120 mL/min/{1.73_m2} (ref >59)
GFR calc non Af Amer: 104 mL/min/{1.73_m2} (ref >59)
Glucose: 104 mg/dL — ABNORMAL HIGH (ref 65–99)
Potassium: 4.5 mmol/L (ref 3.5–5.2)
Sodium: 141 mmol/L (ref 134–144)

## 2020-02-24 ENCOUNTER — Encounter: Payer: Self-pay | Admitting: Family Medicine

## 2020-10-04 ENCOUNTER — Other Ambulatory Visit: Payer: Self-pay

## 2020-10-04 ENCOUNTER — Ambulatory Visit: Payer: BC Managed Care – PPO | Admitting: Family Medicine

## 2020-10-04 ENCOUNTER — Encounter: Payer: Self-pay | Admitting: Family Medicine

## 2020-10-04 VITALS — BP 112/60 | HR 58 | Ht 67.0 in | Wt 146.6 lb

## 2020-10-04 DIAGNOSIS — E785 Hyperlipidemia, unspecified: Secondary | ICD-10-CM | POA: Diagnosis not present

## 2020-10-04 DIAGNOSIS — K219 Gastro-esophageal reflux disease without esophagitis: Secondary | ICD-10-CM

## 2020-10-04 DIAGNOSIS — R7301 Impaired fasting glucose: Secondary | ICD-10-CM

## 2020-10-04 DIAGNOSIS — Z1211 Encounter for screening for malignant neoplasm of colon: Secondary | ICD-10-CM | POA: Diagnosis not present

## 2020-10-04 LAB — POCT GLYCOSYLATED HEMOGLOBIN (HGB A1C): Hemoglobin A1C: 5.3 % (ref 4.0–5.6)

## 2020-10-04 MED ORDER — OMEPRAZOLE 20 MG PO CPDR: 20.0000 mg | DELAYED_RELEASE_CAPSULE | Freq: Every day | ORAL | 3 refills | Status: DC

## 2020-10-04 NOTE — Assessment & Plan Note (Addendum)
Obtaining labs today to assess control. Will recheck LDL and cholesterol levels, previously 201 mg/dL and 280 mg/dL in November 2021. Continue taking 10 mg atorvastatin daily.

## 2020-10-04 NOTE — Progress Notes (Addendum)
    SUBJECTIVE:   CHIEF COMPLAINT / HPI:   Reason: Follow up visit for labs for elevated LDL at November annual physical. Would also like glucose check.  Hyperlipidemia: taking 10 mg atorvastatin daily as prescribed   Other concerns: No complaints today, but does mention reflux that has been more frequent. He has had it in the past, but has not and currently does not take medications for it.  Requests colonoscopy for health maintenance.  PERTINENT  PMH / PSH:  Hyperlipidemia  OBJECTIVE:   BP 112/60   Pulse (!) 58   Ht 5\' 7"  (1.702 m)   Wt 146 lb 9.6 oz (66.5 kg)   SpO2 100%   BMI 22.96 kg/m   GEN: Alert and oriented, well appearing, in no acute distress HEENT: NCAT CARD: Normal rate, rhythm. No murmurs and gallops. PULM: Normal work of breathing on room air. Lungs clear to auscultation. ABD: Soft, nontender to palpation  ASSESSMENT/PLAN:   Hyperlipidemia Obtaining labs today to assess control. Will recheck LDL and cholesterol levels, previously 201 mg/dL and 280 mg/dL in November 2021. Continue taking 10 mg atorvastatin daily.  GERD (gastroesophageal reflux disease) rx omeprazole 20mg  daily Referral sent for EGD at patient request, reports was getting regular EGDs in [place]   Health Maintenance Referral sent for routine colonoscopy. Glucose check via HbA1C, per patient request to monitor progress.  [place]    Patient seen along with MS[employer] student Cristobal Goldmann. I personally evaluated this patient along with the student, and verified all aspects of the history, physical exam, and medical decision making as documented by the student. I agree with the student's documentation and have made all necessary edits.  Chrisandra Netters, MD  [place]

## 2020-10-04 NOTE — Patient Instructions (Addendum)
Checking cholesterol and A1c today Referring to GI, someone will call you with an appointment Sent in medicine for acid reflux  Follow up with me in November for next physical  Be well, Dr. Ardelia Mems

## 2020-10-05 ENCOUNTER — Encounter: Payer: Self-pay | Admitting: Family Medicine

## 2020-10-05 LAB — LDL CHOLESTEROL, DIRECT: LDL Direct: 94 mg/dL (ref 0–99)

## 2020-10-10 DIAGNOSIS — K219 Gastro-esophageal reflux disease without esophagitis: Secondary | ICD-10-CM | POA: Insufficient documentation

## 2020-10-10 NOTE — Assessment & Plan Note (Signed)
rx omeprazole '20mg'$  daily Referral sent for EGD at patient request, reports was getting regular EGDs in [place]

## 2020-10-20 ENCOUNTER — Encounter: Payer: Self-pay | Admitting: Physician Assistant

## 2020-11-17 ENCOUNTER — Encounter: Payer: Self-pay | Admitting: Physician Assistant

## 2020-11-17 ENCOUNTER — Ambulatory Visit: Payer: BC Managed Care – PPO | Admitting: Physician Assistant

## 2020-11-17 VITALS — BP 154/90 | HR 52 | Ht 68.25 in | Wt 147.4 lb

## 2020-11-17 DIAGNOSIS — R1013 Epigastric pain: Secondary | ICD-10-CM

## 2020-11-17 DIAGNOSIS — Z1212 Encounter for screening for malignant neoplasm of rectum: Secondary | ICD-10-CM

## 2020-11-17 DIAGNOSIS — K219 Gastro-esophageal reflux disease without esophagitis: Secondary | ICD-10-CM

## 2020-11-17 DIAGNOSIS — Z1211 Encounter for screening for malignant neoplasm of colon: Secondary | ICD-10-CM

## 2020-11-17 MED ORDER — OMEPRAZOLE 40 MG PO CPDR: 40.0000 mg | DELAYED_RELEASE_CAPSULE | Freq: Every day | ORAL | 3 refills | Status: AC

## 2020-11-17 NOTE — Progress Notes (Signed)
Reviewed and agree with documentation and assessment and plan. K. Veena Sahiti Joswick , MD   

## 2020-11-17 NOTE — Progress Notes (Signed)
Chief Complaint: GERD, screening for colorectal cancer  HPI:    [name] is a 45 year old [place] male with a past medical history of reflux, who was referred to me by Leeanne Rio, MD for a complaint of GERD and screening for colorectal cancer    10/04/2020 patient saw PCP and requested a colonoscopy.  Also discussed reflux and given a prescription for Omeprazole 20 mg daily.  Also requested an EGD at that time.    Today, the patient presents to clinic accompanied by his daughter.  She does assist with history.  He explains that he has pain right in the middle of his stomach immediately after eating most foods, this is typically worse if he has something greasy, spicy or sour.  Also especially worse if he eats a big meal.  This has been going on for the past 3 years at least.  Also occasional regurgitation of acid/trigger reflux symptoms.  After starting Omeprazole 20 mg once daily over the past couple of months this has helped slightly, but symptoms still continue.    Denies previous screening for colorectal cancer.    Denies fever, chills, weight loss, blood in his stool, change in bowel habits, nausea or vomiting.  Past Medical History:  Diagnosis Date   GERD (gastroesophageal reflux disease)    Headache     Past Surgical History:  Procedure Laterality Date   CYSTOSCOPY W/ URETERAL STENT PLACEMENT Left 09/16/2015   Procedure: CYSTOSCOPY WITH LEFT RETROGRADE PYELOGRAM LEFT URETERAL STENT PLACEMENT;  Surgeon: Alexis Frock, MD;  Location: [facility];  Service: Urology;  Laterality: Left;   NEPHROLITHOTOMY Left 09/16/2015   Procedure: LEFT PERCUTANEOUS NEPHROLITHOTOMY WITH SURGEON ACCESS ;  Surgeon: Alexis Frock, MD;  Location: [facility];  Service: Urology;  Laterality: Left;  With LASER   removed tumor from skin      right breast sugery Right    gynecomastia    Current Outpatient Medications  Medication Sig Dispense Refill   atorvastatin (LIPITOR) 10 MG tablet Take 1 tablet  (10 mg total) by mouth daily. 90 tablet 3   omeprazole (PRILOSEC) 20 MG capsule Take 1 capsule (20 mg total) by mouth daily. 30 capsule 3   No current facility-administered medications for this visit.    Allergies as of 11/17/2020   (No Known Allergies)    Family History  Problem Relation Age of Onset   Hyperlipidemia Father     Social History   Socioeconomic History   Marital status: Married    Spouse name: Not on file   Number of children: Not on file   Years of education: Not on file   Highest education level: Not on file  Occupational History   Not on file  Tobacco Use   Smoking status: Never   Smokeless tobacco: Never  Substance and Sexual Activity   Alcohol use: Yes    Alcohol/week: 2.0 standard drinks    Types: 2 Standard drinks or equivalent per week    Comment: socially    Drug use: No   Sexual activity: Not on file  Other Topics Concern   Not on file  Social History Narrative   Married, 2 children (one boy and one girl)   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)   Social Determinants of Radio broadcast assistant Strain: Not on file  Food Insecurity: Not on file  Transportation Needs: Not on file  Physical Activity: Not on file  Stress: Not on file  Social Connections: Not on  file  Intimate Partner Violence: Not on file    Review of Systems:    Constitutional: No weight loss, fever or chills Skin: No rash Cardiovascular: No chest pain Respiratory: No SOB  Gastrointestinal: See HPI and otherwise negative Genitourinary: No dysuria  Neurological: No headache, dizziness or syncope Musculoskeletal: No new muscle or joint pain Hematologic: No bleeding Psychiatric: No history of depression or anxiety   Physical Exam:  Vital signs: BP (!) 154/90 (BP Location: Left Arm, Patient Position: Sitting, Cuff Size: Normal)   Pulse (!) 52   Ht 5' 8.25" (1.734 m) Comment: height measured without shoes  Wt 147 lb 6 oz (66.8 kg)   BMI 22.24 kg/m     Constitutional:   Pleasant [place] male appears to be in NAD, Well developed, Well nourished, alert and cooperative Head:  Normocephalic and atraumatic. Eyes:   PEERL, EOMI. No icterus. Conjunctiva pink. Ears:  Normal auditory acuity. Neck:  Supple Throat: Oral cavity and pharynx without inflammation, swelling or lesion.  Respiratory: Respirations even and unlabored. Lungs clear to auscultation bilaterally.   No wheezes, crackles, or rhonchi.  Cardiovascular: Normal S1, S2. No MRG. Regular rate and rhythm. No peripheral edema, cyanosis or pallor.  Gastrointestinal:  Soft, nondistended, mild epigastric TTP, no rebound or guarding. Normal bowel sounds. No appreciable masses or hepatomegaly. Rectal:  Not performed.  Msk:  Symmetrical without gross deformities. Without edema, no deformity or joint abnormality.  Neurologic:  Alert and  oriented x4;  grossly normal neurologically.  Skin:   Dry and intact without significant lesions or rashes. Psychiatric: Demonstrates good judgement and reason without abnormal affect or behaviors.  No recent labs.  Assessment: 1.  GERD: Symptoms of epigastric pain and reflux over the past 3 years, decreased slightly with Omeprazole 20 mg daily over the past 2 months, worse after eating big meals/spicy/greasy/sour foods; most likely gastritis+/- H. pylori 2.  Epigastric pain 3.  Screening for colorectal cancer: Patient is 36 never had screening for colon cancer  Plan: 1.  Scheduled patient for a diagnostic EGD in the [place] as well as a screening colonoscopy with Dr. Silverio Decamp.  Did provide the patient a detailed list of risks for the procedure and he agrees to proceed. Patient is appropriate for endoscopic procedure(s) in the ambulatory ([place]) setting.  2.  Increased patient's Omeprazole to 40 mg once daily, 30-60 minutes before breakfast.  Prescribed #30 with 5 refills. 3.  Reviewed antireflux diet and lifestyle modifications. 4.  Patient to follow in clinic  per recommendations from Dr. Silverio Decamp after time of procedures.   Ellouise Newer, PA-C [facility] 11/17/2020, 8:24 AM  Cc: Leeanne Rio, MD

## 2020-11-17 NOTE — Patient Instructions (Addendum)
T?ng Omeprazole ln 40 mg m?i ngy 30-60 pht tr??c b?a ?n sng.  B?n ? ???c ln l?ch n?i soi v soi ??i trng. Vui lng lm theo h??ng d?n b?ng v?n b?n ???c cung c?p cho b?n khi b?n truy c?p ngy hm nay. Vui lng nh?n ?? dng chu?n b? c?a b?n t?i hi?u thu?c trong vng 1-3 ngy t?i. N?u b?n s? d?ng ?ng ht (th?m ch ch? khi c?n thi?t), vui lng mang theo chng vo ngy lm th? thu?t.  N?u b?n t? 65 tu?i tr? ln, ch? s? kh?i c? th? c?a b?n nn t? 23-30. Ch? s? kh?i c? th? c?a b?n l 22,24 kg / m. N?u ?i?u ny n?m ngoi ph?m vi ???c li?t k ? trn, vui lng xem xt lin h? v?i Nh cung c?p d?ch v? ch?m [place] chnh c?a b?n.  N?u b?n t? 54 tu?i tr? xu?ng, ch? s? kh?i c? th? c?a b?n nn t? 19-25. Ch? s? kh?i c? th? c?a b?n l 22,24 kg / m. N?u ?i?u ny n?m ngoi ph?m vi ? nu ???c li?t k, vui lng xem xt lin h? v?i Nh cung c?p d?ch v? ch?m [place] chnh c?a b?n.  __________________________________________________________  Cc nh cung c?p [facility] mu?n khuy?n khch b?n s? d?ng MYCHART ?? lin l?c v?i cc nh cung c?p cho cc yu c?u ho?c cu h?i khng kh?n c?p. Do th?i gian gi? ?i?n tho?i lu, g?i tin nh?n cho nh cung c?p c?a b?n b?ng MYCHART c th? l cch nhanh h?n v hi?u qu? h?n ?? nh?n ???c ph?n h?i. Vui lng ??i 48 gi? lm vi?c ?? c ph?n h?i. Hy nh? r?ng ?i?u ny dnh cho cc yu c?u khng kh?n c?p.   ___________________________________________________________________  Increase Omeprazole to 40 mg daily 30-60 minutes before breakfast.  You have been scheduled for an endoscopy and colonoscopy. Please follow the written instructions given to you at your visit today. Please pick up your prep supplies at the pharmacy within the next 1-3 days. If you use inhalers (even only as needed), please bring them with you on the day of your procedure.  If you are age 45 or older, your body mass index should be between 23-30. Your Body mass index is 22.24 kg/m. If this is out of the aforementioned  range listed, please consider follow up with your Primary Care Provider.  If you are age 45 or younger, your body mass index should be between 19-25. Your Body mass index is 22.24 kg/m. If this is out of the aformentioned range listed, please consider follow up with your Primary Care Provider.   __________________________________________________________  The [facility] providers would like to encourage you to use [facility] to communicate with providers for non-urgent requests or questions.  Due to long hold times on the telephone, sending your provider a message by [facility] may be a faster and more efficient way to get a response.  Please allow 48 business hours for a response.  Please remember that this is for non-urgent requests.

## 2020-12-13 ENCOUNTER — Encounter: Payer: Self-pay | Admitting: Gastroenterology

## 2020-12-20 DIAGNOSIS — Z23 Encounter for immunization: Secondary | ICD-10-CM | POA: Diagnosis not present

## 2020-12-26 ENCOUNTER — Ambulatory Visit (AMBULATORY_SURGERY_CENTER): Payer: BC Managed Care – PPO | Admitting: Gastroenterology

## 2020-12-26 ENCOUNTER — Encounter: Payer: Self-pay | Admitting: Gastroenterology

## 2020-12-26 ENCOUNTER — Other Ambulatory Visit: Payer: Self-pay

## 2020-12-26 VITALS — BP 126/89 | HR 60 | Temp 98.7°F | Resp 14 | Ht 68.0 in | Wt 147.0 lb

## 2020-12-26 DIAGNOSIS — K2990 Gastroduodenitis, unspecified, without bleeding: Secondary | ICD-10-CM

## 2020-12-26 DIAGNOSIS — Z1212 Encounter for screening for malignant neoplasm of rectum: Secondary | ICD-10-CM

## 2020-12-26 DIAGNOSIS — K2970 Gastritis, unspecified, without bleeding: Secondary | ICD-10-CM | POA: Diagnosis not present

## 2020-12-26 DIAGNOSIS — K2950 Unspecified chronic gastritis without bleeding: Secondary | ICD-10-CM | POA: Diagnosis not present

## 2020-12-26 DIAGNOSIS — K529 Noninfective gastroenteritis and colitis, unspecified: Secondary | ICD-10-CM

## 2020-12-26 DIAGNOSIS — D122 Benign neoplasm of ascending colon: Secondary | ICD-10-CM

## 2020-12-26 DIAGNOSIS — K219 Gastro-esophageal reflux disease without esophagitis: Secondary | ICD-10-CM | POA: Diagnosis not present

## 2020-12-26 DIAGNOSIS — G8929 Other chronic pain: Secondary | ICD-10-CM

## 2020-12-26 DIAGNOSIS — Z1211 Encounter for screening for malignant neoplasm of colon: Secondary | ICD-10-CM

## 2020-12-26 DIAGNOSIS — K635 Polyp of colon: Secondary | ICD-10-CM

## 2020-12-26 MED ORDER — SODIUM CHLORIDE 0.9 % IV SOLN: 500.0000 mL | Freq: Once | INTRAVENOUS | Status: DC

## 2020-12-26 NOTE — Progress Notes (Signed)
[facility] History and Physical   Primary Care Physician:  Leeanne Rio, MD   Reason for Procedure:  Epigastric pain, GERD, colon cancer screening  Plan:    EGD and Screening colonoscopy with possible interventions as needed     HPI: John Winters is a very pleasant 45 y.o. male here for EGD and colonoscopy. Denies any nausea, vomiting, melena or bright red blood per rectum  The risks and benefits as well as alternatives of endoscopic procedure(s) have been discussed and reviewed. All questions answered. The patient agrees to proceed.  Please refer to office visit note 11/17/20 for additional details  Past Medical History:  Diagnosis Date   GERD (gastroesophageal reflux disease)    Headache    HLD (hyperlipidemia)    Kidney stones     Past Surgical History:  Procedure Laterality Date   CYSTOSCOPY W/ URETERAL STENT PLACEMENT Left 09/16/2015   Procedure: CYSTOSCOPY WITH LEFT RETROGRADE PYELOGRAM LEFT URETERAL STENT PLACEMENT;  Surgeon: Alexis Frock, MD;  Location: [facility];  Service: Urology;  Laterality: Left;   NEPHROLITHOTOMY Left 09/16/2015   Procedure: LEFT PERCUTANEOUS NEPHROLITHOTOMY WITH SURGEON ACCESS ;  Surgeon: Alexis Frock, MD;  Location: [facility];  Service: Urology;  Laterality: Left;  With LASER   removed tumor from skin      right breast sugery Right    gynecomastia    Prior to Admission medications   Medication Sig Start Date End Date Taking? Authorizing Provider  atorvastatin (LIPITOR) 10 MG tablet Take 1 tablet (10 mg total) by mouth daily. 02/19/20   Leeanne Rio, MD  omeprazole (PRILOSEC) 40 MG capsule Take 1 capsule (40 mg total) by mouth daily before breakfast. 11/17/20   Levin Erp, PA    Current Outpatient Medications  Medication Sig Dispense Refill   atorvastatin (LIPITOR) 10 MG tablet Take 1 tablet (10 mg total) by mouth daily. 90 tablet 3   omeprazole (PRILOSEC) 40 MG capsule Take 1 capsule (40 mg total) by  mouth daily before breakfast. 30 capsule 3   Current Facility-Administered Medications  Medication Dose Route Frequency Provider Last Rate Last Admin   0.9 %  sodium chloride infusion  500 mL Intravenous Once Mauri Pole, MD        Allergies as of 12/26/2020   (No Known Allergies)    Family History  Problem Relation Age of Onset   Colon cancer Mother 61       colorectal cancer   Hyperlipidemia Father    Heart disease Father    Diabetes Neg Hx    Liver cancer Neg Hx    Rectal cancer Neg Hx    Stomach cancer Neg Hx    Ovarian cancer Neg Hx    Pancreatic cancer Neg Hx    Prostate cancer Neg Hx     Social History   Socioeconomic History   Marital status: Married    Spouse name: Not on file   Number of children: 2   Years of education: Not on file   Highest education level: Not on file  Occupational History   Occupation: Administrator, [employer]  Tobacco Use   Smoking status: Never   Smokeless tobacco: Never  Vaping Use   Vaping Use: Never used  Substance and Sexual Activity   Alcohol use: Yes    Alcohol/week: 2.0 standard drinks    Types: 2 Standard drinks or equivalent per week    Comment: socially -beers on the weekends   Drug use: No  Sexual activity: Not on file  Other Topics Concern   Not on file  Social History Narrative   Married, 2 children (one boy and one girl)   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  Physical Exam: Vital signs in last 24 hours: BP 111/82   Pulse 84   Temp 98.7 F (37.1 C)   Ht 5\' 8"  (1.727 m)   Wt 147 lb (66.7 kg)   SpO2 100%   BMI 22.35 kg/m      General:   Alert, NAD Lungs:  Clear .   Heart:  Regular rate and rhythm Abdomen:  Soft, nontender and nondistended. Neuro/Psych:  Alert and cooperative. Normal mood and affect. A and O x 3  Reviewed labs, radiology imaging, old records and pertinent past GI work up  Patient is appropriate for planned procedure(s) and anesthesia in an ambulatory setting   K. Denzil Magnuson , MD [phone]

## 2020-12-26 NOTE — Progress Notes (Signed)
Called to room to assist during endoscopic procedure.  Patient ID and intended procedure confirmed with present staff. Received instructions for my participation in the procedure from the performing physician.  

## 2020-12-26 NOTE — Op Note (Signed)
[place] Patient Name: John Winters Procedure Date: 12/26/2020 12:35 PM MRN: [id number] Endoscopist: Mauri Pole , MD Age: 45 Referring MD:  Date of Birth: 1976-02-12 Gender: Male Account #: [id number] Procedure:                Colonoscopy Indications:              Screening for colorectal malignant neoplasm Medicines:                Monitored Anesthesia Care Procedure:                Pre-Anesthesia Assessment:                           - Prior to the procedure, a History and Physical                            was performed, and patient medications and                            allergies were reviewed. The patient's tolerance of                            previous anesthesia was also reviewed. The risks                            and benefits of the procedure and the sedation                            options and risks were discussed with the patient.                            All questions were answered, and informed consent                            was obtained. Prior Anticoagulants: The patient has                            taken no previous anticoagulant or antiplatelet                            agents. ASA Grade Assessment: II - A patient with                            mild systemic disease. After reviewing the risks                            and benefits, the patient was deemed in                            satisfactory condition to undergo the procedure.                           After obtaining informed consent, the colonoscope  was passed under direct vision. Throughout the                            procedure, the patient's blood pressure, pulse, and                            oxygen saturations were monitored continuously. The                            Olympus PCF-[id number] (#[id number]) Colonoscope was                            introduced through the anus and advanced to the the                            cecum,  identified by appendiceal orifice and                            ileocecal valve. The colonoscopy was performed                            without difficulty. The patient tolerated the                            procedure well. The quality of the bowel                            preparation was excellent. The ileocecal valve,                            appendiceal orifice, and rectum were photographed. Scope In: 1:51:02 PM Scope Out: 2:13:05 PM Scope Withdrawal Time: 0 hours 18 minutes 13 seconds  Total Procedure Duration: 0 hours 22 minutes 3 seconds  Findings:                 The perianal and digital rectal examinations were                            normal.                           A 7 mm polyp was found in the ascending colon. The                            polyp was sessile. The polyp was removed with a                            cold snare. Resection and retrieval were complete.                           A patchy area of mildly erythematous mucosa was                            found in the rectum, in the sigmoid colon and in  the descending colon. Biopsies were taken with a                            cold forceps for histology.                           Non-bleeding external and internal hemorrhoids were                            found during retroflexion. The hemorrhoids were                            medium-sized. Complications:            No immediate complications. Estimated Blood Loss:     Estimated blood loss: none. Impression:               - One 7 mm polyp in the ascending colon, removed                            with a cold snare. Resected and retrieved.                           - Erythematous mucosa in the rectum, in the sigmoid                            colon and in the descending colon. Biopsied.                           - Non-bleeding external and internal hemorrhoids. Recommendation:           - Patient has a contact number available  for                            emergencies. The signs and symptoms of potential                            delayed complications were discussed with the                            patient. Return to normal activities tomorrow.                            Written discharge instructions were provided to the                            patient.                           - Resume previous diet.                           - Continue present medications.                           - Await pathology results.                           -  Repeat colonoscopy in 5-10 years for surveillance                            based on pathology results. Mauri Pole, MD 12/26/2020 2:24:29 PM This report has been signed electronically.

## 2020-12-26 NOTE — Progress Notes (Signed)
Pt in recovery with monitors in place, VSS. Report given to receiving RN. Bite guard was placed with pt awake to ensure comfort. No dental or soft tissue damage noted. 

## 2020-12-26 NOTE — Op Note (Signed)
[facility] Patient Name: John Winters Procedure Date: 12/26/2020 12:36 PM MRN: [id number] Endoscopist: Mauri Pole , MD Age: 45 Referring MD:  Date of Birth: 1975/08/08 Gender: Male Account #: [id number] Procedure:                Upper GI endoscopy Indications:              Epigastric abdominal pain, Esophageal reflux                            symptoms that recur despite appropriate therapy Medicines:                Monitored Anesthesia Care Procedure:                Pre-Anesthesia Assessment:                           - Prior to the procedure, a History and Physical                            was performed, and patient medications and                            allergies were reviewed. The patient's tolerance of                            previous anesthesia was also reviewed. The risks                            and benefits of the procedure and the sedation                            options and risks were discussed with the patient.                            All questions were answered, and informed consent                            was obtained. Prior Anticoagulants: The patient has                            taken no previous anticoagulant or antiplatelet                            agents. ASA Grade Assessment: II - A patient with                            mild systemic disease. After reviewing the risks                            and benefits, the patient was deemed in                            satisfactory condition to undergo the procedure.  After obtaining informed consent, the endoscope was                            passed under direct vision. Throughout the                            procedure, the patient's blood pressure, pulse, and                            oxygen saturations were monitored continuously. The                            GIF [id number] #[id number] was introduced through the                            mouth, and  advanced to the second part of duodenum.                            The upper GI endoscopy was accomplished without                            difficulty. The patient tolerated the procedure                            well. Scope In: Scope Out: Findings:                 The Z-line was regular and was found 38 cm from the                            incisors.                           No gross lesions were noted in the entire esophagus.                           The gastroesophageal flap valve was visualized                            endoscopically and classified as Hill Grade III                            (minimal fold, loose to endoscope, hiatal hernia                            likely).                           Patchy mild inflammation characterized by                            congestion (edema) and erythema was found in the                            entire examined stomach. Biopsies were taken with a  cold forceps for Helicobacter pylori testing.                           The cardia and gastric fundus were normal on                            retroflexion.                           The examined duodenum was normal. Complications:            No immediate complications. Estimated Blood Loss:     Estimated blood loss was minimal. Impression:               - Z-line regular, 38 cm from the incisors.                           - No gross lesions in esophagus.                           - Gastroesophageal flap valve classified as Hill                            Grade III (minimal fold, loose to endoscope, hiatal                            hernia likely).                           - Gastritis. Biopsied.                           - Normal examined duodenum. Recommendation:           - Patient has a contact number available for                            emergencies. The signs and symptoms of potential                            delayed complications were  discussed with the                            patient. Return to normal activities tomorrow.                            Written discharge instructions were provided to the                            patient.                           - Resume previous diet.                           - Continue present medications.                           -  Await pathology results.                           - Follow an antireflux regimen.                           - Use Prilosec (omeprazole) 40 mg PO daily. Mauri Pole, MD 12/26/2020 2:21:45 PM This report has been signed electronically.

## 2020-12-26 NOTE — Patient Instructions (Signed)
Please read handouts provided. Continue present medications, including Omeprazole 40 mg daily. Await pathology results. Follow an antireflux regimen.   YOU HAD AN ENDOSCOPIC PROCEDURE TODAY AT [facility]:   Refer to the procedure report that was given to you for any specific questions about what was found during the examination.  If the procedure report does not answer your questions, please call your gastroenterologist to clarify.  If you requested that your care partner not be given the details of your procedure findings, then the procedure report has been included in a sealed envelope for you to review at your convenience later.  YOU SHOULD EXPECT: Some feelings of bloating in the abdomen. Passage of more gas than usual.  Walking can help get rid of the air that was put into your GI tract during the procedure and reduce the bloating. If you had a lower endoscopy (such as a colonoscopy or flexible sigmoidoscopy) you may notice spotting of blood in your stool or on the toilet paper. If you underwent a bowel prep for your procedure, you may not have a normal bowel movement for a few days.  Please Note:  You might notice some irritation and congestion in your nose or some drainage.  This is from the oxygen used during your procedure.  There is no need for concern and it should clear up in a day or so.  SYMPTOMS TO REPORT IMMEDIATELY:  Following lower endoscopy (colonoscopy or flexible sigmoidoscopy):  Excessive amounts of blood in the stool  Significant tenderness or worsening of abdominal pains  Swelling of the abdomen that is new, acute  Fever of 100F or higher  Following upper endoscopy (EGD)  Vomiting of blood or coffee ground material  New chest pain or pain under the shoulder blades  Painful or persistently difficult swallowing  New shortness of breath  Fever of 100F or higher  Black, tarry-looking stools  For urgent or emergent issues, a gastroenterologist can be  reached at any hour by calling [phone]. Do not use MyChart messaging for urgent concerns.    DIET:  We do recommend a small meal at first, but then you may proceed to your regular diet.  Drink plenty of fluids but you should avoid alcoholic beverages for 24 hours.  ACTIVITY:  You should plan to take it easy for the rest of today and you should NOT DRIVE or use heavy machinery until tomorrow (because of the sedation medicines used during the test).    FOLLOW UP: Our staff will call the number listed on your records 48-72 hours following your procedure to check on you and address any questions or concerns that you may have regarding the information given to you following your procedure. If we do not reach you, we will leave a message.  We will attempt to reach you two times.  During this call, we will ask if you have developed any symptoms of COVID 19. If you develop any symptoms (ie: fever, flu-like symptoms, shortness of breath, cough etc.) before then, please call [phone].  If you test positive for Covid 19 in the 2 weeks post procedure, please call and report this information to [place].    If any biopsies were taken you will be contacted by phone or by letter within the next 1-3 weeks.  Please call us at [phone] if you have not heard about the biopsies in 3 weeks.    SIGNATURES/CONFIDENTIALITY: You and/or your care partner have signed paperwork which will be  entered into your electronic medical record.  These signatures attest to the fact that that the information above on your After Visit Summary has been reviewed and is understood.  Full responsibility of the confidentiality of this discharge information lies with you and/or your care-partner.

## 2020-12-28 ENCOUNTER — Telehealth: Payer: Self-pay

## 2020-12-28 ENCOUNTER — Telehealth: Payer: Self-pay | Admitting: *Deleted

## 2020-12-28 NOTE — Telephone Encounter (Signed)
  Follow up Call-  Call back number 12/26/2020  Post procedure Call Back phone  # [phone]  Permission to leave phone message Yes  Some recent data might be hidden     Patient questions:  Do you have a fever, pain , or abdominal swelling? No. Pain Score  0 *  Have you tolerated food without any problems? Yes.  Have you been able to return to your normal activities? Yes  Do you have any questions about your discharge instructions: Diet   No. Medications  No. Follow up visit  No.  Do you have questions or concerns about your Care? No.  Actions: * If pain score is 4 or above: No action needed, pain <4.  Have you developed a fever since your procedure? no  2.   Have you had an respiratory symptoms (SOB or cough) since your procedure? no  3.   Have you tested positive for COVID 19 since your procedure no  4.   Have you had any family members/close contacts diagnosed with the COVID 19 since your procedure?  no   If yes to any of these questions please route to Joylene John, RN and Joella Prince, RN

## 2020-12-28 NOTE — Telephone Encounter (Signed)
Attempted to call patient for their post-procedure follow-up call. No answer. Left voicemail.   

## 2021-01-12 ENCOUNTER — Encounter: Payer: Self-pay | Admitting: Gastroenterology

## 2021-02-12 ENCOUNTER — Other Ambulatory Visit: Payer: Self-pay | Admitting: Family Medicine

## 2021-10-30 ENCOUNTER — Ambulatory Visit: Payer: BC Managed Care – PPO | Admitting: Family Medicine

## 2021-10-30 ENCOUNTER — Encounter: Payer: Self-pay | Admitting: Family Medicine

## 2021-10-30 VITALS — BP 140/101 | HR 64 | Ht 67.0 in | Wt 146.0 lb

## 2021-10-30 DIAGNOSIS — Z0000 Encounter for general adult medical examination without abnormal findings: Secondary | ICD-10-CM

## 2021-10-30 DIAGNOSIS — E785 Hyperlipidemia, unspecified: Secondary | ICD-10-CM | POA: Diagnosis not present

## 2021-10-30 DIAGNOSIS — R030 Elevated blood-pressure reading, without diagnosis of hypertension: Secondary | ICD-10-CM

## 2021-10-30 DIAGNOSIS — Z202 Contact with and (suspected) exposure to infections with a predominantly sexual mode of transmission: Secondary | ICD-10-CM

## 2021-10-30 DIAGNOSIS — K219 Gastro-esophageal reflux disease without esophagitis: Secondary | ICD-10-CM

## 2021-10-30 NOTE — Progress Notes (Signed)
  Date of Visit: 10/30/2021   SUBJECTIVE:   HPI:  John Winters presents today for a well adult male exam.   Concerns today: none STD Screening: desires RPR today, wife history of latent syphylis that was treated successfully Exercise: walks Diet: tries to eat healthy Smoking: no Alcohol: no Drugs: no Mood: no concerns Cancers in family: none Dentist: yes goes regularly  Hyperlipidemia - was told to stop statin by a physician at his work. Wants lipids checked today  GERD - taking omeprazole '40mg'$  daily, symptoms well controlled on this OBJECTIVE:   BP (!) 140/101   Pulse 64   Ht '5\' 7"'$  (1.702 m)   Wt 146 lb (66.2 kg)   SpO2 100%   BMI 22.87 kg/m  Gen: NAD, pleasant, cooperative HEENT: NCAT, PERRL, no palpable thyromegaly or anterior cervical lymphadenopathy Heart: RRR, no murmurs Lungs: CTAB, NWOB Abdomen: soft, nontender to palpation Neuro: grossly nonfocal, speech normal Extremities: No appreciable lower extremity edema bilaterally   ASSESSMENT/PLAN:   Health maintenance:  -STD screening: RPR today at patient/wife request, otherwise declines STI screening -immunizations: Flu: advised to return for flu shot when available Tdap: UTD COVID: UTD -colon cancer screening: UTD -handout given on health maintenance topics  Hyperlipidemia Update lipids & CMET today Unclear why he was told by physician at his job to stop statin Anticipate will need to restart but will await lipids  GERD (gastroesophageal reflux disease) Well controlled. Continue current medication regimen.   Blood pressure elevated Blood pressure up including on recheck RN visit scheduled this Friday to recheck If remains high then, will likely need to start medication.   FOLLOW UP: Follow up in 4 days for BP recheck at RN visit  [place] J. Ardelia Mems, [place]

## 2021-10-30 NOTE — Assessment & Plan Note (Signed)
Update lipids & CMET today Unclear why he was told by physician at his job to stop statin Anticipate will need to restart but will await lipids

## 2021-10-30 NOTE — Patient Instructions (Signed)
It was great to see you again today!  Checking labs today Come back for nurse visit to recheck blood pressure  Be well, Dr. Ardelia Mems   Ch?m [place] phng ng?a 40-64 tu?i, Nam gi?i Preventive Care 79-46 Years Old, Male Ch?m [place] phng ng?a ?? c?p ??n cc l?a ch?n l?i s?ng v cc l?n khm v?i chuyn gia ch?m [place] s?c kh?e c th? t?ng c??ng s?c kh?e v h?nh phc. Cc l?n khm ch?m [place] phng ng?a cn ???c g?i l khm s?c kh?e. Ti c th? d? ki?n nh?ng g ??i v?i l?n khm ch?m [place] phng ng?a c?a ti? T? v?n [place] l?n khm ch?m [place] phng ng?a, chuyn gia ch?m [place] s?c kh?e c th? h?i v?: B?nh s?, bao g?m: Cc v?n ?? b?nh l tr??c ?y. B?nh s? gia ?nh. S?c kh?e hi?n t?i, bao g?m: Tnh tr?ng s?c kh?e tinh th?n. Cu?c s?ng ? nh v tnh tr?ng h?nh phc trong m?i quan h?. Quan h? tnh d?c. L?i s?ng, bao g?m: S? d?ng r??u, nicotine ho?c thu?c l v ma ty. Ti?p c?n v?i v? kh. Cc thi quen ?n, t?p luy?n v ng?. Cc v?n ?? an ton nh? th?t dy an ton v ??i m? b?o hi?m xe ??p. S? d?ng kem ch?ng n?ng. Cng vi?c v mi tr??ng lm vi?c. Khm th?c th? Chuyn gia ch?m [place] s?c kh?e s? ki?m tra qu v? v?: Chi?u cao v cn n?ng. Nh?ng thng s? ny c th? ???c s? d?ng ?? tnh ton BMI (ch? s? kh?i c? th?) c?a qu v?. BMI l m?t s? ?o cho bi?t qu v? c cn n?ng t?t cho s?c kh?e hay khng. Vng eo. ?y l ?o vng eo c?a qu v?. Php ?o ny c?ng cho bi?t li?u qu v? c cn n?ng t?t cho s?c kh?e khng v c th? gip d? ?on nguy c? m?c m?t s? b?nh, ch?ng h?n nh? ti?u ???ng tup 2 v huy?t p cao. Nh?p tim v huy?t p. Thn nhi?t. Da xem c cc ??m b?t th??ng hay khng. Ti c?n ch?ng ng?a nh?ng g?  V?c xin th??ng ???c tim ? cc ?? tu?i khc nhau, theo l?ch. Chuyn gia ch?m [place] s?c kh?e s? khuy?n ngh? cc lo?i v?c xin dnh cho qu v? d?a trn tu?i, b?nh s? v l?i s?ng ho?c cc y?u t? khc, ch?ng h?n nh? ?i l?i ho?c n?i qu v? lm vi?c. Ti c?n lm cc xt nghi?m no? Sng l?c Chu[place] gia ch?m  s?c kh?e  c th? khuy?n ngh? cc ki?m tra sng l?c cho m?t s? tnh tr?ng nh?t ??nh. Vi?c ny c th? bao g?m: N?ng ?? lipid v cholesterol. Sng l?c ti?u ???ng. Vi?c ny ???c th?c hi?n b?ng cch ki?m tra ???ng huy?t (glucose) sau khi qu v? khng ?n g trong m?t kho?ng th?i gian (nh?n ?i). Xt nghi?m vim gan B. Xt nghi?m vim gan C. Xt nghi?m HIV (vi rt gy suy gi?m mi?n d?ch ? ng??i). Xt nghi?m STI (b?nh nhi?m trng ly truy?n qua ???ng tnh d?c), n?u qu v? c nguy c?. Sng l?c ung th? ph?i. Sng l?c ung th? tuy?n ti?n li?t. Sng l?c ung th? ??i tr?c trng. Trao ??i v?i chuyn gia ch?m  s?c kh?e cu?a qu v? v? cc k?t qu? xt nghi?m, cc l?a ch?n ?i?u tr? v n?u c?n, nhu c?u lm thm cc ki?m tra. Tun th? nh?ng h??ng d?n ny ? nh: ?n v u?ng  ?n ch? ?? ?n bao g?m tri cy v rau c? t??i, ng?  c?c nguyn h?t, protein t? th?t n?c v cc s?n ph?m t? s?a t ch?t bo. Dng th?c ph?m ch?c n?ng c vitamin v ch?t khong theo khuy?n ngh? c?a chuyn gia ch?m [place] s?c kh?e. Khng u?ng r??u n?u chuyn gia ch?m [place] s?c kh?e c?a qu v? [place] qu v? khng u?ng r??u. N?u qu v? u?ng r??u: Gi?i h?n l??ng r??u qu v? u?ng ? 0-2 ly m?i ngy. Bi?t m?t ly c bao nhiu r??u. ? M?, m?t ly t??ng ???ng v?i m?t chai bia 12 ao x? (355 mL), m?t ly r??u vang 5 ao x? (148 mL), ho?c m?t ly r??u m?nh 1 ao x? (44 mL). L?i s?ng ?nh r?ng m?i sng v t?i b?ng kem ?nh r?ng c flo. Dng ch? nha khoa lm s?ch k? r?ng m?i ngy m?t l?n. T?p th? d?c t nh?t 30 pht, t? 5 ngy tr? ln m?i tu?n. Khng s? d?ng b?t k? s?n ph?m no c nicotine ho?c thu?c l. Nh?ng s?n ph?m ny bao g?m thu?c l d?ng ht, thu?c l d?ng nhai v d?ng c? ht thu?c, ch?ng h?n nh? thu?c l ?i?n t?. N?u qu v? c?n gip ?? ?? cai thu?c, hy h?i chuyn gia ch?m [place] s?c kh?e. Khng s? d?ng ma ty. N?u qu v? c quan h? tnh d?c, hy th?c hnh quan h? tnh d?c an ton. S? d?ng bao cao su ho?c hnh th?c b?o v? khc ?? phng ng?a STI. Ch? s? d?ng aspirin theo  ch? d?n c?a chuyn gia ch?m [place] s?c kh?e cu?a quy? vi?. Hy ch?c ch?n r?ng qu v? hi?u r c?n dng bao nhiu v dng d?ng no. Lm vi?c v?i chuyn gia ch?m [place] s?c kh?e ?? tm hi?u xem vi?c dng aspirin hng ngy c an ton v c l?i cho qu v? hay khng. Tm cc cch lnh m?nh ?? qu?n l c?ng th?ng, ch?ng h?n nh?: Thi?n, yoga, ho?c nghe nh?c. Vi?t nh?t k. Ni chuy?n v?i m?t ng??i ?ng tin c?y. Dnh th?i gian cho b?n b v gia ?nh. Gi?m thi?u ti?p xc v?i b?c x? UV ?? gi?m nguy c? ung th? da. An ton Lun th?t dy an ton trong khi li xe ho?c ng?i trn ph??ng ti?n ?i l?i. Khng li xe: N?u qu v? ? u?ng r??u. Khng ?i xe cng v?i m?t ng??i v?a u?ng r??u. Khi qu v? m?t m?i ho?c r?i tr. Trong khi nh?n tin. N?u qu v? ? s? d?ng b?t k? ch?t lm thay ??i tm tr ho?c ma ty. ??i m? b?o hi?m v cc ?? b?o v? khc trong khi tham gia ho?t ??ng th? thao. N?u qu v? c v? kh trong nh, hy ??m b?o qu v? lm theo t?t c? cc quy trnh an ton v?i sng. C?n lm g ti?p theo? ??n g?p chuyn gia ch?m [place] s?c kh?e m?i n?m m?t l?n ?? ki?m tra s?c kh?e hng n?m. Hy h?i chuyn gia ch?m [place] s?c kh?e v? vi?c qu v? nn khm m?t v r?ng bao lu m?t l?n. Tim v?c xin ??y ??Tera Mater tin ny khng nh?m m?c ?ch thay th? cho l?i khuyn m chuyn gia ch?m [place] s?c kh?e ni v?i qu v?. Hy b?o ??m qu v? ph?i th?o lu?n b?t k? v?n ?? g m qu v? c v?i chuyn gia ch?m [place] s?c kh?e c?a qu v?. Document Revised: 10/06/2020 Document Reviewed: 10/06/2020 Elsevier Patient Education  [place].

## 2021-10-30 NOTE — Assessment & Plan Note (Signed)
Well controlled. Continue current medication regimen.  

## 2021-10-31 LAB — CMP14+EGFR
ALT: 19 IU/L (ref 0–44)
AST: 15 IU/L (ref 0–40)
Albumin/Globulin Ratio: 1.3 (ref 1.2–2.2)
Albumin: 4.5 g/dL (ref 4.1–5.1)
Alkaline Phosphatase: 64 IU/L (ref 44–121)
BUN/Creatinine Ratio: 16 (ref 9–20)
BUN: 13 mg/dL (ref 6–24)
Bilirubin Total: 0.4 mg/dL (ref 0.0–1.2)
CO2: 25 mmol/L (ref 20–29)
Calcium: 9.1 mg/dL (ref 8.7–10.2)
Chloride: 103 mmol/L (ref 96–106)
Creatinine, Ser: 0.82 mg/dL (ref 0.76–1.27)
Globulin, Total: 3.5 g/dL (ref 1.5–4.5)
Glucose: 97 mg/dL (ref 70–99)
Potassium: 4.7 mmol/L (ref 3.5–5.2)
Sodium: 141 mmol/L (ref 134–144)
Total Protein: 8 g/dL (ref 6.0–8.5)
eGFR: 110 mL/min/{1.73_m2} (ref >59)

## 2021-10-31 LAB — LIPID PANEL
Chol/HDL Ratio: 4.4 ratio (ref 0.0–5.0)
Cholesterol, Total: 254 mg/dL — ABNORMAL HIGH (ref 100–199)
HDL: 58 mg/dL (ref >39)
LDL Chol Calc (NIH): 181 mg/dL — ABNORMAL HIGH (ref 0–99)
Triglycerides: 85 mg/dL (ref 0–149)
VLDL Cholesterol Cal: 15 mg/dL (ref 5–40)

## 2021-10-31 LAB — RPR: RPR Ser Ql: NONREACTIVE

## 2021-11-01 ENCOUNTER — Telehealth: Payer: Self-pay | Admitting: Family Medicine

## 2021-11-01 NOTE — Telephone Encounter (Signed)
Attempted to reach patient by phone to discuss labs, using [place]et[place] interpreter. No answer. LVM asking them to call back.  Leeanne Rio, MD

## 2021-11-02 MED ORDER — ATORVASTATIN CALCIUM 10 MG PO TABS: ORAL_TABLET | ORAL | 3 refills | Status: DC

## 2021-11-02 NOTE — Telephone Encounter (Signed)
Called and spoke with patient's daughter Evelena Leyden (DPR on file) Advised lipids elevated, recommend resuming statin Will send in rx Other labs normal Daughter appreciative  Leeanne Rio, MD

## 2021-11-03 ENCOUNTER — Ambulatory Visit (INDEPENDENT_AMBULATORY_CARE_PROVIDER_SITE_OTHER): Payer: BC Managed Care – PPO

## 2021-11-03 VITALS — BP 126/84 | HR 74

## 2021-11-03 DIAGNOSIS — Z0130 Encounter for examination of blood pressure without abnormal findings: Secondary | ICD-10-CM

## 2021-11-03 NOTE — Progress Notes (Unsigned)
Patient here today for BP check.      Last BP was on 10/30/2021 and was 140/101.  BP today is 126/84 with a pulse of 74.    Checked BP in left arm with regular adult cuff.    Symptoms present: none. Advised to follow up with office as needed.   Routed note to PCP.      [place] Interpreter- Tye [place] [phone] used for entire visit.   Talbot Grumbling, RN

## 2022-05-02 DIAGNOSIS — R7309 Other abnormal glucose: Secondary | ICD-10-CM | POA: Diagnosis not present

## 2022-05-02 DIAGNOSIS — E785 Hyperlipidemia, unspecified: Secondary | ICD-10-CM | POA: Diagnosis not present

## 2022-05-02 DIAGNOSIS — K219 Gastro-esophageal reflux disease without esophagitis: Secondary | ICD-10-CM | POA: Diagnosis not present

## 2022-05-02 DIAGNOSIS — Z719 Counseling, unspecified: Secondary | ICD-10-CM | POA: Diagnosis not present

## 2022-05-02 DIAGNOSIS — Z0000 Encounter for general adult medical examination without abnormal findings: Secondary | ICD-10-CM | POA: Diagnosis not present

## 2022-05-02 DIAGNOSIS — Z125 Encounter for screening for malignant neoplasm of prostate: Secondary | ICD-10-CM | POA: Diagnosis not present

## 2022-05-03 DIAGNOSIS — Z0189 Encounter for other specified special examinations: Secondary | ICD-10-CM | POA: Diagnosis not present

## 2022-07-24 DIAGNOSIS — K219 Gastro-esophageal reflux disease without esophagitis: Secondary | ICD-10-CM | POA: Diagnosis not present

## 2022-07-24 DIAGNOSIS — E785 Hyperlipidemia, unspecified: Secondary | ICD-10-CM | POA: Diagnosis not present

## 2022-07-24 DIAGNOSIS — R030 Elevated blood-pressure reading, without diagnosis of hypertension: Secondary | ICD-10-CM | POA: Diagnosis not present

## 2022-07-24 DIAGNOSIS — F439 Reaction to severe stress, unspecified: Secondary | ICD-10-CM | POA: Diagnosis not present

## 2022-07-27 DIAGNOSIS — K219 Gastro-esophageal reflux disease without esophagitis: Secondary | ICD-10-CM | POA: Diagnosis not present

## 2022-07-27 DIAGNOSIS — Z760 Encounter for issue of repeat prescription: Secondary | ICD-10-CM | POA: Diagnosis not present

## 2022-10-23 DIAGNOSIS — K219 Gastro-esophageal reflux disease without esophagitis: Secondary | ICD-10-CM | POA: Diagnosis not present

## 2022-10-23 DIAGNOSIS — E785 Hyperlipidemia, unspecified: Secondary | ICD-10-CM | POA: Diagnosis not present

## 2022-10-23 DIAGNOSIS — Z789 Other specified health status: Secondary | ICD-10-CM | POA: Diagnosis not present

## 2023-02-18 ENCOUNTER — Encounter: Payer: Self-pay | Admitting: Family Medicine

## 2023-02-18 ENCOUNTER — Ambulatory Visit (INDEPENDENT_AMBULATORY_CARE_PROVIDER_SITE_OTHER): Payer: BC Managed Care – PPO | Admitting: Family Medicine

## 2023-02-18 VITALS — BP 108/64 | HR 75 | Ht 67.0 in | Wt 142.0 lb

## 2023-02-18 DIAGNOSIS — E785 Hyperlipidemia, unspecified: Secondary | ICD-10-CM | POA: Diagnosis not present

## 2023-02-18 DIAGNOSIS — Z0000 Encounter for general adult medical examination without abnormal findings: Secondary | ICD-10-CM | POA: Diagnosis not present

## 2023-02-18 NOTE — Progress Notes (Unsigned)
    SUBJECTIVE:   Chief compliant/HPI: annual examination  John Winters is a 47 y.o. who presents today for an annual exam. He is accompanied today by his daughter. A [place] interpreter was used for the entirety of this visit.  Concerns today: None Exercise: Walks a lot at his work, 5 days a week; also reports being active at home Diet: Eats home-cooked food, balanced diet, occasional sweets (fruits), drinks coffee (cup in the morning) Sleep: Good, 5-6 hours a night, feels well rested when he wakes Smoking: None Alcohol: Once a month Recreational drugs: None Mood: No concerns FHx Cancer: Mother with colon cancer in her 50s; pt with colonoscopy in 2022 Dentist: No  Hyperlipidemia: Compliant with statin, no side effects.  GERD: Compliant with Prilosec, no side effects. Denies reflux.  Has gotten flu shot at work in 12/2022 and covid vaccination at [facility] 12/2022.  History tabs reviewed and updated.   OBJECTIVE:   BP 108/64   Pulse 75   Ht 5\' 7"  (1.702 m)   Wt 142 lb (64.4 kg)   SpO2 95%   BMI 22.24 kg/m   General: Pt is seated on a chair, no acute distress. Cardiovascular: RRR, no murmurs, rubs, gallops. Pulmonary: Normal work of breathing. Lungs clear to auscultation bilaterally. Neuro/Psych: Alert and oriented to person, place, event, time. Normal affect.  ASSESSMENT/PLAN:   Assessment & Plan Routine adult health maintenance Normal exam today. Up to date on health maintenance items Hyperlipidemia, unspecified hyperlipidemia type Check lipids and CMET, continue lipitor 40mg  daily    John Winters, Medical Student John Winters [facility]    Patient seen along with medical student John Winters. I personally evaluated this patient along with the student, and verified all aspects of the history, physical exam, and medical decision making as documented by the student. I agree with the student's documentation and have made all necessary edits.  John Dodrill, MD, IBCLC [facility]

## 2023-02-18 NOTE — Patient Instructions (Signed)
Health Maintenance, Male Adopting a healthy lifestyle and getting preventive care are important in promoting health and wellness. Ask your health care provider about: The right schedule for you to have regular tests and exams. Things you can do on your own to prevent diseases and keep yourself healthy. What should I know about diet, weight, and exercise? Eat a healthy diet  Eat a diet that includes plenty of vegetables, fruits, low-fat dairy products, and lean protein. Do not eat a lot of foods that are high in solid fats, added sugars, or sodium. Maintain a healthy weight Body mass index (BMI) is a measurement that can be used to identify possible weight problems. It estimates body fat based on height and weight. Your health care provider can help determine your BMI and help you achieve or maintain a healthy weight. Get regular exercise Get regular exercise. This is one of the most important things you can do for your health. Most adults should: Exercise for at least 150 minutes each week. The exercise should increase your heart rate and make you sweat (moderate-intensity exercise). Do strengthening exercises at least twice a week. This is in addition to the moderate-intensity exercise. Spend less time sitting. Even light physical activity can be beneficial. Watch cholesterol and blood lipids Have your blood tested for lipids and cholesterol at 47 years of age, then have this test every 5 years. You may need to have your cholesterol levels checked more often if: Your lipid or cholesterol levels are high. You are older than 47 years of age. You are at high risk for heart disease. What should I know about cancer screening? Many types of cancers can be detected early and may often be prevented. Depending on your health history and family history, you may need to have cancer screening at various ages. This may include screening for: Colorectal cancer. Prostate cancer. Skin cancer. Lung  cancer. What should I know about heart disease, diabetes, and high blood pressure? Blood pressure and heart disease High blood pressure causes heart disease and increases the risk of stroke. This is more likely to develop in people who have high blood pressure readings or are overweight. Talk with your health care provider about your target blood pressure readings. Have your blood pressure checked: Every 3-5 years if you are 18-39 years of age. Every year if you are 40 years old or older. If you are between the ages of 65 and 75 and are a current or former smoker, ask your health care provider if you should have a one-time screening for abdominal aortic aneurysm (AAA). Diabetes Have regular diabetes screenings. This checks your fasting blood sugar level. Have the screening done: Once every three years after age 45 if you are at a normal weight and have a low risk for diabetes. More often and at a younger age if you are overweight or have a high risk for diabetes. What should I know about preventing infection? Hepatitis B If you have a higher risk for hepatitis B, you should be screened for this virus. Talk with your health care provider to find out if you are at risk for hepatitis B infection. Hepatitis C Blood testing is recommended for: Everyone born from 1945 through 1965. Anyone with known risk factors for hepatitis C. Sexually transmitted infections (STIs) You should be screened each year for STIs, including gonorrhea and chlamydia, if: You are sexually active and are younger than 47 years of age. You are older than 47 years of age and your   health care provider tells you that you are at risk for this type of infection. Your sexual activity has changed since you were last screened, and you are at increased risk for chlamydia or gonorrhea. Ask your health care provider if you are at risk. Ask your health care provider about whether you are at high risk for HIV. Your health care provider  may recommend a prescription medicine to help prevent HIV infection. If you choose to take medicine to prevent HIV, you should first get tested for HIV. You should then be tested every 3 months for as long as you are taking the medicine. Follow these instructions at home: Alcohol use Do not drink alcohol if your health care provider tells you not to drink. If you drink alcohol: Limit how much you have to 0-2 drinks a day. Know how much alcohol is in your drink. In the [place] one drink equals one 12 oz bottle of beer (355 mL), one 5 oz glass of wine (148 mL), or one 1 oz glass of hard liquor (44 mL). Lifestyle Do not use any products that contain nicotine or tobacco. These products include cigarettes, chewing tobacco, and vaping devices, such as e-cigarettes. If you need help quitting, ask your health care provider. Do not use street drugs. Do not share needles. Ask your health care provider for help if you need support or information about quitting drugs. General instructions Schedule regular health, dental, and eye exams. Stay current with your vaccines. Tell your health care provider if: You often feel depressed. You have ever been abused or do not feel safe at home. Summary Adopting a healthy lifestyle and getting preventive care are important in promoting health and wellness. Follow your health care provider's instructions about healthy diet, exercising, and getting tested or screened for diseases. Follow your health care provider's instructions on monitoring your cholesterol and blood pressure. This information is not intended to replace advice given to you by your health care provider. Make sure you discuss any questions you have with your health care provider. Document Revised: 07/25/2020 Document Reviewed: 07/25/2020 Elsevier Patient Education  2024 [employer]  

## 2023-02-19 LAB — CMP14+EGFR
ALT: 30 [IU]/L (ref 0–44)
AST: 23 [IU]/L (ref 0–40)
Albumin: 4.1 g/dL (ref 4.1–5.1)
Alkaline Phosphatase: 73 [IU]/L (ref 44–121)
BUN/Creatinine Ratio: 17 (ref 9–20)
BUN: 15 mg/dL (ref 6–24)
Bilirubin Total: 0.5 mg/dL (ref 0.0–1.2)
CO2: 25 mmol/L (ref 20–29)
Calcium: 9.3 mg/dL (ref 8.7–10.2)
Chloride: 102 mmol/L (ref 96–106)
Creatinine, Ser: 0.88 mg/dL (ref 0.76–1.27)
Globulin, Total: 3.3 g/dL (ref 1.5–4.5)
Glucose: 98 mg/dL (ref 70–99)
Potassium: 4.3 mmol/L (ref 3.5–5.2)
Sodium: 140 mmol/L (ref 134–144)
Total Protein: 7.4 g/dL (ref 6.0–8.5)
eGFR: 107 mL/min/{1.73_m2} (ref >59)

## 2023-02-19 LAB — LIPID PANEL
Chol/HDL Ratio: 3.2 ratio (ref 0.0–5.0)
Cholesterol, Total: 175 mg/dL (ref 100–199)
HDL: 54 mg/dL
LDL Chol Calc (NIH): 104 mg/dL — ABNORMAL HIGH (ref 0–99)
Triglycerides: 92 mg/dL (ref 0–149)
VLDL Cholesterol Cal: 17 mg/dL (ref 5–40)

## 2023-02-20 NOTE — Assessment & Plan Note (Signed)
Check lipids and CMET, continue lipitor 40mg  daily

## 2023-10-02 DIAGNOSIS — Z1331 Encounter for screening for depression: Secondary | ICD-10-CM | POA: Diagnosis not present

## 2023-10-02 DIAGNOSIS — Z758 Other problems related to medical facilities and other health care: Secondary | ICD-10-CM | POA: Diagnosis not present

## 2023-10-02 DIAGNOSIS — E785 Hyperlipidemia, unspecified: Secondary | ICD-10-CM | POA: Diagnosis not present

## 2023-10-02 DIAGNOSIS — R030 Elevated blood-pressure reading, without diagnosis of hypertension: Secondary | ICD-10-CM | POA: Diagnosis not present

## 2023-10-02 DIAGNOSIS — K219 Gastro-esophageal reflux disease without esophagitis: Secondary | ICD-10-CM | POA: Diagnosis not present

## 2023-10-02 DIAGNOSIS — Z719 Counseling, unspecified: Secondary | ICD-10-CM | POA: Diagnosis not present

## 2024-01-06 DIAGNOSIS — Z603 Acculturation difficulty: Secondary | ICD-10-CM | POA: Diagnosis not present

## 2024-01-06 DIAGNOSIS — Z758 Other problems related to medical facilities and other health care: Secondary | ICD-10-CM | POA: Diagnosis not present

## 2024-01-06 DIAGNOSIS — K219 Gastro-esophageal reflux disease without esophagitis: Secondary | ICD-10-CM | POA: Diagnosis not present

## 2024-01-06 DIAGNOSIS — E785 Hyperlipidemia, unspecified: Secondary | ICD-10-CM | POA: Diagnosis not present

## 2024-01-06 DIAGNOSIS — Z760 Encounter for issue of repeat prescription: Secondary | ICD-10-CM | POA: Diagnosis not present

## 2024-01-06 DIAGNOSIS — R7301 Impaired fasting glucose: Secondary | ICD-10-CM | POA: Diagnosis not present

## 2024-03-02 ENCOUNTER — Ambulatory Visit: Admitting: Family Medicine

## 2024-03-02 ENCOUNTER — Encounter: Payer: Self-pay | Admitting: Family Medicine

## 2024-03-02 VITALS — BP 126/86 | HR 78 | Ht 67.0 in | Wt 141.2 lb

## 2024-03-02 DIAGNOSIS — Z0000 Encounter for general adult medical examination without abnormal findings: Secondary | ICD-10-CM

## 2024-03-02 DIAGNOSIS — Z0184 Encounter for antibody response examination: Secondary | ICD-10-CM | POA: Diagnosis not present

## 2024-03-02 DIAGNOSIS — K219 Gastro-esophageal reflux disease without esophagitis: Secondary | ICD-10-CM | POA: Diagnosis not present

## 2024-03-02 DIAGNOSIS — Z23 Encounter for immunization: Secondary | ICD-10-CM | POA: Diagnosis not present

## 2024-03-02 DIAGNOSIS — E785 Hyperlipidemia, unspecified: Secondary | ICD-10-CM | POA: Diagnosis not present

## 2024-03-02 NOTE — Progress Notes (Unsigned)
°  Date of Visit: 03/02/2024   SUBJECTIVE:   HPI:  Jakell presents today for a well adult male exam.   Concerns today: *** Sexual activity: *** STD Screening: *** Exercise: *** Diet: *** Smoking: *** Alcohol: *** Drugs: *** Advance directives: *** Mood: *** Cancers in family: *** Dentist: ***  OBJECTIVE:   BP 126/86   Pulse 78   Ht 5' 7 (1.702 m)   Wt 141 lb 3.2 oz (64 kg)   SpO2 98%   BMI 22.12 kg/m  Gen: NAD, pleasant, cooperative HEENT: NCAT, PERRL, no palpable thyromegaly or anterior cervical lymphadenopathy Heart: RRR, no murmurs Lungs: CTAB, NWOB Abdomen: soft, nontender to palpation Neuro: grossly nonfocal, speech normal GU: ***  ASSESSMENT/PLAN:   Health maintenance:  -STD screening: *** -immunizations: Flu: *** Tdap: *** Shingrix: *** COVID: *** Pneumovax: *** -lipid screening: *** -colon cancer screening: *** -AAA screening: *** -lung cancer screening: *** -prostate cancer screening: *** -advance directives: *** -handout given on health maintenance topics  Assessment & Plan Routine adult health maintenance  Encounter for immunization  Immunity status testing   FOLLOW UP: Follow up in *** for ***  Yandriel Boening J. Donah, MD [facility]

## 2024-03-03 ENCOUNTER — Ambulatory Visit: Payer: Self-pay | Admitting: Family Medicine

## 2024-03-03 LAB — HEPATITIS B SURFACE ANTIBODY, QUANTITATIVE: Hepatitis B Surf Ab Quant: 11.1 m[IU]/mL

## 2024-03-04 NOTE — Assessment & Plan Note (Signed)
Well controlled, continue omeprazole.  

## 2024-03-04 NOTE — Assessment & Plan Note (Signed)
 Well controlled. Continue current medication regimen.
